# Patient Record
Sex: Female | Born: 1995 | Race: Black or African American | Hispanic: No | Marital: Single | Smoking: Never smoker
Health system: Southern US, Community
[De-identification: ages and names within clinical notes are randomized; demographics above are authoritative.]

## PROBLEM LIST (undated history)

## (undated) DIAGNOSIS — N63 Unspecified lump in unspecified breast: Secondary | ICD-10-CM

## (undated) DIAGNOSIS — D500 Iron deficiency anemia secondary to blood loss (chronic): Secondary | ICD-10-CM

## (undated) DIAGNOSIS — R87611 Atypical squamous cells cannot exclude high grade squamous intraepithelial lesion on cytologic smear of cervix (ASC-H): Secondary | ICD-10-CM

## (undated) HISTORY — DX: Atypical squamous cells cannot exclude high grade squamous intraepithelial lesion on cytologic smear of cervix (ASC-H): R87.611

## (undated) HISTORY — DX: Unspecified lump in unspecified breast: N63.0

## (undated) HISTORY — DX: Iron deficiency anemia secondary to blood loss (chronic): D50.0

---
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
## 2015-02-21 ENCOUNTER — Other Ambulatory Visit: Payer: Self-pay | Admitting: Family Medicine

## 2015-10-13 ENCOUNTER — Other Ambulatory Visit: Payer: Self-pay | Admitting: Family Medicine

## 2015-10-14 NOTE — Telephone Encounter (Signed)
Christine Carr pts mom(DPR signed) wanted to ck status of BC pill refill; advised refill done as requested earlier and Christine Beckey will ck with pharmacy; pt will keep upcoming appt.

## 2015-10-22 ENCOUNTER — Telehealth: Payer: Self-pay | Admitting: *Deleted

## 2015-10-22 ENCOUNTER — Encounter: Payer: Self-pay | Admitting: Family Medicine

## 2015-10-22 ENCOUNTER — Ambulatory Visit (INDEPENDENT_AMBULATORY_CARE_PROVIDER_SITE_OTHER): Payer: Managed Care, Other (non HMO) | Admitting: Family Medicine

## 2015-10-22 VITALS — BP 102/60 | HR 109 | Temp 99.6°F | Ht 65.75 in | Wt 122.2 lb

## 2015-10-22 DIAGNOSIS — J111 Influenza due to unidentified influenza virus with other respiratory manifestations: Secondary | ICD-10-CM | POA: Insufficient documentation

## 2015-10-22 DIAGNOSIS — J029 Acute pharyngitis, unspecified: Secondary | ICD-10-CM

## 2015-10-22 LAB — POCT RAPID STREP A (OFFICE): Rapid Strep A Screen: NEGATIVE

## 2015-10-22 MED ORDER — BENZONATATE 200 MG PO CAPS: 200.0000 mg | ORAL_CAPSULE | Freq: Three times a day (TID) | ORAL | Status: DC | PRN

## 2015-10-22 MED ORDER — OSELTAMIVIR PHOSPHATE 75 MG PO CAPS: 75.0000 mg | ORAL_CAPSULE | Freq: Two times a day (BID) | ORAL | Status: DC

## 2015-10-22 MED ORDER — PROMETHAZINE HCL 25 MG PO TABS: 12.5000 mg | ORAL_TABLET | Freq: Three times a day (TID) | ORAL | Status: DC | PRN

## 2015-10-22 NOTE — Telephone Encounter (Signed)
Christine Carr- I must have forgotten to send that  I will send it now  My apologies

## 2015-10-22 NOTE — Progress Notes (Signed)
Pre visit review using our clinic review tool, if applicable. No additional management support is needed unless otherwise documented below in the visit note. 

## 2015-10-22 NOTE — Patient Instructions (Addendum)
Take tamiflu for flu 5 days (I think you have the flu  Rest and drink fluids  Try phenergan for nausea - it will sedate/ be careful  For cough - mucinex DM max  Also tessalon  Tylenol for fever as directed up to every 8 hours  No school today or tomorrow  Go home and rest

## 2015-10-22 NOTE — Progress Notes (Signed)
Subjective:    Patient ID: Christine Carr, female    DOB: 03/01/1996, 20 y.o.   MRN: [id number]  HPI Here for ST and nausea  Last Tuesday got sick - runny nose and ST- better by the end of the week  Sunday out of nowhere-cough got worse and ST returned  Then chills (no aches) and fever- ? How high  Is nauseated starting today  (on OC no missed periods) No vomiting  No diarrhea   Taking day quil and alka selzer cold  Helpful   Throat pain is not severe   Cough- is both dry and productive - clear mucous  Nasal d/c is clear  No facial pain     Did not get a flu shot   Results for orders placed or performed in visit on 10/22/15  Rapid Strep A  Result Value Ref Range   Rapid Strep A Screen Negative Negative     Temp low grade today    Suite mate was sick - ? A cold  Room mate sick- with uri   Patient Active Problem List   Diagnosis Date Noted  . Viral URI with cough 07/17/2014  . 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 Encounter for other general counseling or advice on contraception 06/12/2013  . Well adolescent visit 01/30/2011   Past Medical History  Diagnosis Date  . Lump or mass in breast 2013    right    No past surgical history on file. Social History  Substance Use Topics  . Smoking status: Never Smoker   . Smokeless tobacco: Never Used  . Alcohol Use: No   No family history on file. No Known Allergies Current Outpatient Prescriptions on File Prior to Visit  Medication Sig Dispense Refill  . fluticasone (FLONASE) 50 MCG/ACT nasal spray Place 2 sprays into both nostrils daily. 16 g 2  . JUNEL 1/20 1-20 MG-MCG tablet TAKE 1 TABLET BY MOUTH DAILY. AS DIRECTED 21 tablet 0   No current facility-administered medications on file prior to visit.      Review of Systems  Constitutional: Positive for fever, chills, appetite change and fatigue.  HENT: Positive for congestion, postnasal drip, rhinorrhea, sinus pressure, sneezing and sore throat.  Negative for ear pain.   Eyes: Negative for pain and discharge.  Respiratory: Positive for cough. Negative for shortness of breath, wheezing and stridor.   Cardiovascular: Negative for chest pain.  Gastrointestinal: Negative for nausea, vomiting and diarrhea.  Genitourinary: Negative for urgency, frequency and hematuria.  Musculoskeletal: Negative for myalgias and arthralgias.  Skin: Negative for rash.  Neurological: Positive for headaches. Negative for dizziness, weakness and light-headedness.  Psychiatric/Behavioral: Negative for confusion and dysphoric mood.   Review of Systems         Objective:   Physical Exam  Constitutional: She appears well-developed and well-nourished. No distress.  HENT:  Head: Normocephalic and atraumatic.  Right Ear: External ear normal.  Left Ear: External ear normal.  Mouth/Throat: Oropharynx is clear and moist.  Nares are injected and congested  No sinus tenderness Clear rhinorrhea and post nasal drip   Eyes: Conjunctivae and EOM are normal. Pupils are equal, round, and reactive to light. Right eye exhibits no discharge. Left eye exhibits no discharge.  Neck: Normal range of motion. Neck supple.  Cardiovascular: Normal rate and normal heart sounds.   Pulmonary/Chest: Effort normal and breath sounds normal. No respiratory distress. She has no wheezes. She has no rales. She exhibits no tenderness.  Harsh cough -  hacking  Lymphadenopathy:    She has no cervical adenopathy.  Neurological: She is alert.  Skin: Skin is warm and dry. No rash noted.  Psychiatric: She has a normal mood and affect.          Assessment & Plan:   Problem List Items Addressed This Visit      Other   Influenza with respiratory manifestation    Clinical dx with fever /chills/cough and ST Cover with tamiflu  Disc symptomatic care - see instructions on AVS - phenergan for nausea/tessalon for cough  Take tamiflu for flu 5 days (I think you have the flu  Rest and  drink fluids  Try phenergan for nausea - it will sedate/ be careful  For cough - mucinex DM max  Also tessalon  Tylenol for fever as directed up to every 8 hours  No school today or tomorrow  Go home and rest  Update if not starting to improve in a week or if worsening         Other Visit Diagnoses    Sore throat    -  Primary    Relevant Orders    Rapid Strep A (Completed)

## 2015-10-22 NOTE — Telephone Encounter (Signed)
Patient was seen today for flu symptoms.  She was advised per AVS to phenergan.  Mom states this was not at the pharmacy when she picked up the other meds.  Okay to send in phenergan?

## 2015-10-23 ENCOUNTER — Ambulatory Visit: Payer: Managed Care, Other (non HMO) | Admitting: Family Medicine

## 2015-10-23 NOTE — Assessment & Plan Note (Signed)
Clinical dx with fever /chills/cough and ST Cover with tamiflu  Disc symptomatic care - see instructions on AVS - phenergan for nausea/tessalon for cough  Take tamiflu for flu 5 days (I think you have the flu  Rest and drink fluids  Try phenergan for nausea - it will sedate/ be careful  For cough - mucinex DM max  Also tessalon  Tylenol for fever as directed up to every 8 hours  No school today or tomorrow  Go home and rest  Update if not starting to improve in a week or if worsening

## 2015-10-24 ENCOUNTER — Telehealth: Payer: Self-pay | Admitting: Family Medicine

## 2015-10-24 NOTE — Telephone Encounter (Signed)
Please write the note and I will sign it tomorrow- that is fine

## 2015-10-24 NOTE — Telephone Encounter (Signed)
Mom called stating pt needs school note she will be out of school all this week Please let them know when it is ready for pick

## 2015-10-25 NOTE — Telephone Encounter (Signed)
Mother notified letter ready for pick-up

## 2015-11-21 ENCOUNTER — Ambulatory Visit: Payer: Self-pay | Admitting: General Surgery

## 2016-01-07 ENCOUNTER — Other Ambulatory Visit: Payer: Self-pay | Admitting: Family Medicine

## 2016-01-20 ENCOUNTER — Ambulatory Visit (INDEPENDENT_AMBULATORY_CARE_PROVIDER_SITE_OTHER): Payer: Managed Care, Other (non HMO) | Admitting: Family Medicine

## 2016-01-20 ENCOUNTER — Encounter: Payer: Self-pay | Admitting: Family Medicine

## 2016-01-20 VITALS — BP 102/64 | HR 96 | Temp 98.4°F | Ht 66.0 in | Wt 120.8 lb

## 2016-01-20 DIAGNOSIS — Z3009 Encounter for other general counseling and advice on contraception: Secondary | ICD-10-CM

## 2016-01-20 MED ORDER — NORETHINDRONE ACET-ETHINYL EST 1-20 MG-MCG PO TABS: ORAL_TABLET | ORAL | Status: DC

## 2016-01-20 NOTE — Progress Notes (Signed)
Subjective:    Patient ID: Christine Carr, female    DOB: [birth year], 20 y.o.   MRN: [id number]  HPI Here for f/u of chronic issues/refills   Doing well  Finished finals last week - did well  Will go back for a summer session - just over a month- will begin organic  Will take a break   Has a week off to do nothing  Will probably get a job in July   Feeling ok  Wt down 2 lb with bmi of 19  Not eating regularly-that will be getting better when she is at home  At school- fast food and snacks  No time for self care    Leroy Kennedy 1/20  Working out well  occ forgets to take it so uses an alarm  Periods are ok - they last 5 days / pretty normal/ not too heavy or painful  Has been out this month   Not currently sexually active  Does not desire STD tests  Still a non smoker  No breast lumps on self exam   No gyn c/o  No vaginal d/c   Has had 3 HPV shots   Can't remember if she had a flu shot every season  Did get the flu in Feb  Will get a flu shot this fall   Patient Active Problem List   Diagnosis Date Noted  . Influenza with respiratory manifestation 10/22/2015  . 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 Encounter for other general counseling or advice on contraception 06/12/2013  . Well adolescent visit 01/30/2011   Past Medical History  Diagnosis Date  . Lump or mass in breast 2013    right    No past surgical history on file. Social History  Substance Use Topics  . Smoking status: Never Smoker   . Smokeless tobacco: Never Used  . Alcohol Use: No   No family history on file. No Known Allergies Current Outpatient Prescriptions on File Prior to Visit  Medication Sig Dispense Refill  . fluticasone (FLONASE) 50 MCG/ACT nasal spray Place 2 sprays into both nostrils daily. 16 g 2  . JUNEL 1/20 1-20 MG-MCG tablet TAKE 1 TABLET BY MOUTH DAILY. AS DIRECTED 21 tablet 0  . benzonatate (TESSALON) 200 MG capsule Take 1 capsule (200 mg total) by mouth 3 (three)  times daily as needed for cough (swallow whole- do not bite pill). For cough (Patient not taking: Reported on 01/20/2016) 30 capsule 1   No current facility-administered medications on file prior to visit.    Review of Systems    Review of Systems  Constitutional: Negative for fever, appetite change, fatigue and unexpected weight change.  Eyes: Negative for pain and visual disturbance.  Respiratory: Negative for cough and shortness of breath.   Cardiovascular: Negative for cp or palpitations    Gastrointestinal: Negative for nausea, diarrhea and constipation.  Genitourinary: Negative for urgency and frequency.  Skin: Negative for pallor or rash   Neurological: Negative for weakness, light-headedness, numbness and headaches.  Hematological: Negative for adenopathy. Does not bruise/bleed easily.  Psychiatric/Behavioral: Negative for dysphoric mood. The patient is not nervous/anxious.      Objective:   Physical Exam  Constitutional: She appears well-developed and well-nourished. No distress.  Well appearing  HENT:  Head: Normocephalic and atraumatic.  Mouth/Throat: Oropharynx is clear and moist.  Eyes: Conjunctivae and EOM are normal. Pupils are equal, round, and reactive to light.  Neck: Normal range of motion. Neck supple. No JVD present.  Carotid bruit is not present. No thyromegaly present.  Cardiovascular: Normal rate, regular rhythm, normal heart sounds and intact distal pulses.  Exam reveals no gallop.   Pulmonary/Chest: Effort normal and breath sounds normal. No respiratory distress. She has no wheezes. She has no rales.  No crackles  Abdominal: Soft. Bowel sounds are normal. She exhibits no distension, no abdominal bruit and no mass. There is no tenderness.  No suprapubic tenderness or fullness    Musculoskeletal: She exhibits no edema.  Lymphadenopathy:    She has no cervical adenopathy.  Neurological: She is alert. She has normal reflexes.  Skin: Skin is warm and dry. No  rash noted.  Psychiatric: She has a normal mood and affect.  Pleasant and talkative          Assessment & Plan:   Problem List Items Addressed This Visit      Other   Encounter for other general counseling or advice on contraception - Primary    Pt is doing well on current OC and that was refilled Rev menstrual hx  Not sexually active-does not desire STD screen Disc imp of not smoking and taking med compliantly  Will plan on pap at age 67 Has had HPV vaccines

## 2016-01-20 NOTE — Patient Instructions (Signed)
I refilled your pills Don't smoke  Take care of yourself -try not to miss meals  Don't forget to get a flu shot in the fall If you ever want STD screening let me know

## 2016-01-20 NOTE — Progress Notes (Signed)
Pre visit review using our clinic review tool, if applicable. No additional management support is needed unless otherwise documented below in the visit note. 

## 2016-01-20 NOTE — Assessment & Plan Note (Signed)
Pt is doing well on current OC and that was refilled Rev menstrual hx  Not sexually active-does not desire STD screen Disc imp of not smoking and taking med compliantly  Will plan on pap at age 20 Has had HPV vaccines

## 2016-10-07 ENCOUNTER — Encounter: Payer: Self-pay | Admitting: *Deleted

## 2016-10-13 ENCOUNTER — Inpatient Hospital Stay: Payer: Self-pay

## 2016-10-13 ENCOUNTER — Encounter: Payer: Self-pay | Admitting: General Surgery

## 2016-10-13 ENCOUNTER — Ambulatory Visit (INDEPENDENT_AMBULATORY_CARE_PROVIDER_SITE_OTHER): Payer: Managed Care, Other (non HMO) | Admitting: General Surgery

## 2016-10-13 VITALS — BP 110/70 | HR 88 | Resp 12 | Ht 65.0 in | Wt 127.0 lb

## 2016-10-13 DIAGNOSIS — N6310 Unspecified lump in the right breast, unspecified quadrant: Secondary | ICD-10-CM | POA: Diagnosis not present

## 2016-10-13 NOTE — Patient Instructions (Signed)
  Patient to return in one year right breast ultrasound.

## 2016-10-13 NOTE — Progress Notes (Signed)
Patient ID: Christine Carr, female   DOB: June 27, 1996, 21 y.o.   MRN: [id number]  Chief Complaint  Patient presents with  . Follow-up    right breast mass    HPI Christine Carr is a 21 y.o. female here today for her follow up right breast mass. Patient states they area may be a little bigger, but remains asymptomatic.  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 Remains on the teens list.  The patient did miss/ears appointment. HPI  Past Medical History:  Diagnosis Date  . Lump or mass in breast 2013   right     No past surgical history on file.  No family history on file.  Social History Social History  Substance Use Topics  . Smoking status: Never Smoker  . Smokeless tobacco: Never Used  . Alcohol use No    No Known Allergies  Current Outpatient Prescriptions  Medication Sig Dispense Refill  . fluticasone (FLONASE) 50 MCG/ACT nasal spray Place 2 sprays into both nostrils daily. 16 g 2  . norethindrone-ethinyl estradiol (JUNEL 1/20) 1-20 MG-MCG tablet TAKE 1 TABLET BY MOUTH DAILY. AS DIRECTED 21 tablet 11   No current facility-administered medications for this visit.     Review of Systems Review of Systems  Constitutional: Negative.   Respiratory: Negative.   Cardiovascular: Negative.     Blood pressure 110/70, pulse 88, resp. rate 12, height 5\' 5"  (1.651 m), weight 127 lb (57.6 kg).  Physical Exam Physical Exam  Constitutional: She is oriented to person, place, and time. She appears well-developed and well-nourished.  Eyes: Conjunctivae are normal. No scleral icterus.  Neck: Neck supple.  Cardiovascular: Normal rate, regular rhythm and normal heart sounds.   Pulmonary/Chest: Effort normal and breath sounds normal. Right breast exhibits mass. Right breast exhibits no inverted nipple, no nipple discharge, no skin change and no tenderness. Left breast exhibits no inverted nipple, no mass, no nipple discharge, no skin change and no tenderness.     Abdominal: Soft. Bowel sounds are normal. There is no tenderness.  Lymphadenopathy:    She has no cervical adenopathy.  Neurological: She is alert and oriented to person, place, and time.  Skin: Skin is warm and dry.    Data Reviewed Ultrasound examination of the right breast was completed to assess the multiple previously identified nodules.  At the 10:00 position 8 cm from the nipple a 1.0 x 1.51 x 1.59 cm mass is identified. This is smoothly marginated, shows posterior acoustic enhancement and a homogeneous echo pattern. This is consistent with a fibroadenoma. Previously biopsied.  At the 10:00 position 1 cm from the nipple a 0.4 x 0.73 x 0.93 hypoechoic, smoothly marginated mass with faint posterior acoustic enhancement is noted again consistent with a fibroadenoma.  At the 9:00 position 1 cm from the nipple a smoothly marginated 0.77 x 1.4 x 1.47 cm hypoechoic mass with a homogeneous echo pattern in faint acoustic enhancement is noted, consistent with a fibroadenoma.  At the 8:00 position, 4 cm from the nipple a softly lobulated hypoechoic mass with faint posterior acoustic enhancement is noted measuring 0.73 x 1.17 x 1.3 cm.This is consistent with a multilobulated fibroadenoma. Each of these lesion showed a slight increase in size since her examination 2 years ago. The most dramatic changes in the lesion at the 8:00 position which shows a 3 mm increase in size over the last 2 years.BI-RADS-3.  February 20, 2013 right breast biopsy at 10:00 position:  RIGHT BREAST 10:00, BIOPSY: -  BENIGN BREAST TISSUE WITH NODULAR FIBROCYSTIC CHANGE. - NEGATIVE FOR ATYPIA AND MALIGNANCY. Comment Sections demonstrate a well circumscribed area of fibrocystic change including adenosis, cyst formation, pseudo angiomatous stromal hyperplasia, and fibroadenomatous change.    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    Plan         Patient to return in one year right breast ultrasound.  This information has been scribed by Gaspar Cola CMA.  Robert Bellow 10/14/2016, 7:44 PM

## 2016-12-23 ENCOUNTER — Encounter: Payer: Self-pay | Admitting: Emergency Medicine

## 2016-12-23 ENCOUNTER — Encounter (INDEPENDENT_AMBULATORY_CARE_PROVIDER_SITE_OTHER): Payer: Self-pay

## 2016-12-23 ENCOUNTER — Ambulatory Visit (INDEPENDENT_AMBULATORY_CARE_PROVIDER_SITE_OTHER): Payer: Managed Care, Other (non HMO) | Admitting: Family Medicine

## 2016-12-23 ENCOUNTER — Encounter: Payer: Self-pay | Admitting: Family Medicine

## 2016-12-23 ENCOUNTER — Ambulatory Visit: Payer: Managed Care, Other (non HMO) | Admitting: Family Medicine

## 2016-12-23 VITALS — BP 116/84 | HR 96 | Temp 98.5°F | Wt 124.5 lb

## 2016-12-23 DIAGNOSIS — J069 Acute upper respiratory infection, unspecified: Secondary | ICD-10-CM

## 2016-12-23 NOTE — Patient Instructions (Signed)
For nasal congestion you can use Afrin nasal spray for 3 days max, Sudafed, saline nasal spray (generic is fine for all). Get an over the counter antihistamine- Zyrtec, Allegra or Claritin- generic is fine For cough you can try Delsym. Drink enough fluids to make your urine light yellow. For fever/chill/muscle aches you can take over the counter acetaminophen or ibuprofen.  Please come back in if you are not better in 5-7 days or if you develop wheezing, shortness of breath or persistent vomiting.

## 2016-12-23 NOTE — Progress Notes (Signed)
Pre visit review using our clinic review tool, if applicable. No additional management support is needed unless otherwise documented below in the visit note. 

## 2016-12-23 NOTE — Progress Notes (Signed)
   Subjective:    Patient ID: Christine Carr, female    DOB: 1996/01/17, 21 y.o.   MRN: [id number]  HPI This is a 21 yo female who presents today with cough (nonproductive) and nasal congestion x 2 days. Sore and scratchy throat, runny nose with clear drainage. Felt warm, no known fever. No SOB or wheeze. Took some Dayquil today with some relief. Has also been sneezing. No known sick contacts.  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   Past Medical History:  Diagnosis Date  . Lump or mass in breast 2013   right    No past surgical history on file. No family history on file. Social History  Substance Use Topics  . Smoking status: Never Smoker  . Smokeless tobacco: Never Used  . Alcohol use No      Review of Systems Per HPI    Objective:   Physical Exam  Constitutional: She appears well-developed and well-nourished. No distress.  HENT:  Head: Normocephalic and atraumatic.  Right Ear: Tympanic membrane, external ear and ear canal normal.  Left Ear: Tympanic membrane, external ear and ear canal normal.  Nose: Mucosal edema and rhinorrhea present.  Mouth/Throat: Uvula is midline. No oropharyngeal exudate, posterior oropharyngeal edema or posterior oropharyngeal erythema.  Large amount post nasal drainage.   Skin: She is not diaphoretic.  Vitals reviewed.     BP 116/84 (BP Location: Left Arm, Patient Position: Sitting, Cuff Size: Normal)   Pulse 96   Temp 98.5 F (36.9 C) (Oral)   Wt 124 lb 8 oz (56.5 kg)   LMP 12/01/2016   SpO2 99%   BMI 20.72 kg/m  Wt Readings from Last 3 Encounters:  12/23/16 124 lb 8 oz (56.5 kg)  10/13/16 127 lb (57.6 kg)  01/20/16 120 lb 12.8 oz (54.8 kg) (36 %, Z= -0.36)*   * Growth percentiles are based on CDC 2-20 Years data.       Assessment & Plan:  1. Viral URI -  Patient Instructions  For nasal congestion you can use Afrin nasal spray for 3 days max, Sudafed, saline nasal spray (generic is  fine for all). Get an over the counter antihistamine- Zyrtec, Allegra or Claritin- generic is fine For cough you can try Delsym. Drink enough fluids to make your urine light yellow. For fever/chill/muscle aches you can take over the counter acetaminophen or ibuprofen.  Please come back in if you are not better in 5-7 days or if you develop wheezing, shortness of breath or persistent vomiting.     Christine Reamer, FNP-BC  [facility]  12/23/2016 2:29 PM

## 2017-01-20 ENCOUNTER — Other Ambulatory Visit: Payer: Self-pay | Admitting: Family Medicine

## 2017-01-20 NOTE — Telephone Encounter (Signed)
Pt hasn't seen you in over a year but has had a few acute appts with other providers, and no future appts., please advise

## 2017-01-20 NOTE — Telephone Encounter (Signed)
Please schedule f/u for 30 min PE with gyn exam after her 81 birthday in [place]  Refill until then  Thanks

## 2017-01-22 NOTE — Telephone Encounter (Signed)
Left voicemail requesting pt to call the office back and schedule her CPE after her bday

## 2017-01-25 NOTE — Telephone Encounter (Signed)
done

## 2017-01-25 NOTE — Telephone Encounter (Signed)
Patient scheduled cpx on 05/28/17.  Please refill bcp.

## 2017-05-28 ENCOUNTER — Encounter: Payer: Self-pay | Admitting: Family Medicine

## 2017-05-28 ENCOUNTER — Ambulatory Visit (INDEPENDENT_AMBULATORY_CARE_PROVIDER_SITE_OTHER): Payer: 59 | Admitting: Family Medicine

## 2017-05-28 VITALS — BP 104/62 | HR 76 | Temp 98.4°F | Ht 66.0 in | Wt 124.8 lb

## 2017-05-28 DIAGNOSIS — N6011 Diffuse cystic mastopathy of right breast: Secondary | ICD-10-CM | POA: Diagnosis not present

## 2017-05-28 DIAGNOSIS — Z23 Encounter for immunization: Secondary | ICD-10-CM | POA: Diagnosis not present

## 2017-05-28 DIAGNOSIS — Z0000 Encounter for general adult medical examination without abnormal findings: Secondary | ICD-10-CM | POA: Diagnosis not present

## 2017-05-28 DIAGNOSIS — T2130XD Burn of third degree of trunk, unspecified site, subsequent encounter: Secondary | ICD-10-CM

## 2017-05-28 DIAGNOSIS — N6012 Diffuse cystic mastopathy of left breast: Secondary | ICD-10-CM

## 2017-05-28 MED ORDER — NORETHINDRONE ACET-ETHINYL EST 1-20 MG-MCG PO TABS: 1.0000 | ORAL_TABLET | Freq: Every day | ORAL | 11 refills | Status: DC

## 2017-05-28 NOTE — Progress Notes (Signed)
Subjective:    Patient ID: Christine Carr, female    DOB: Jan 13, 1996, 21 y.o.   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   Wt Readings from Last 3 Encounters:  05/28/17 124 lb 12 oz (56.6 kg)  12/23/16 124 lb 8 oz (56.5 kg)  10/13/16 127 lb (57.6 kg)  stable  20.14 kg/m  HIV screen   Tetanus shot 8/08  Will get today   She had had HPV vaccine series  Pap/gyn exam  Never been sexually active  On junel 1/20- periods are pretty regular  occ cramps /this varies  Last period was heavier but she missed a pill Otherwise-pretty tolerable   Has had [place] for breast lumps/fibrocystic dz Sees Dr Fleet Contras yearly-in the spring    She is in a relationship currently   No exp or concerns for STDs    Flu shot -will get today   Diet is healthy / tried her best  Too much take out but her mother cooks  Lots of vegetables  Exercise - walking to class    Family hx: Aunt had lung cancer (maternal)  M uncle - DM  M uncle - CAD   Non smoker   No new breast lumps  Not interested in wellness labs yet   Patient Active Problem List   Diagnosis Date Noted  . Routine general medical examination at a health care facility 05/28/2017  . Fibrocystic breast changes of both breasts 11/21/2013  . Encounter for other general counseling or advice on contraception 06/12/2013   Past Medical History:  Diagnosis Date  . Lump or mass in breast 2013   right    No past surgical history on file. Social History  Substance Use Topics  . Smoking status: Never Smoker  . Smokeless tobacco: Never Used  . Alcohol use 0.0 oz/week     Comment: socially   Family History  Problem Relation Age of Onset  . Lung cancer Maternal Aunt   . CAD Maternal Uncle    No Known Allergies Current Outpatient Prescriptions on File Prior to Visit  Medication  Sig Dispense Refill  . fluticasone (FLONASE) 50 MCG/ACT nasal spray Place 2 sprays into both nostrils daily. 16 g 2   No current facility-administered medications on file prior to visit.     Review of Systems  Constitutional: Negative for activity change, appetite change, fatigue, fever and unexpected weight change.  HENT: Negative for congestion, ear pain, rhinorrhea, sinus pressure and sore throat.   Eyes: Negative for pain, redness and visual disturbance.  Respiratory: Negative for cough, shortness of breath and wheezing.   Cardiovascular: Negative for chest pain and palpitations.  Gastrointestinal: Negative for abdominal pain, blood in stool, constipation and diarrhea.  Endocrine: Negative for polydipsia and polyuria.  Genitourinary: Negative for dysuria, frequency and urgency.  Musculoskeletal: Negative for arthralgias, back pain and myalgias.  Skin: Negative for pallor and rash.  Allergic/Immunologic: Negative for environmental allergies.  Neurological: Negative for dizziness, syncope and headaches.  Hematological: Negative for adenopathy. Does not bruise/bleed easily.  Psychiatric/Behavioral: Negative for decreased concentration and dysphoric mood. The patient is not nervous/anxious.        Objective:   Physical Exam  Constitutional: She appears well-developed and well-nourished. No distress.  Well appearing   HENT:  Head: Normocephalic and atraumatic.  Right  Ear: External ear normal.  Left Ear: External ear normal.  Mouth/Throat: Oropharynx is clear and moist.  Eyes: Pupils are equal, round, and reactive to light. Conjunctivae and EOM are normal. No scleral icterus.  Neck: Normal range of motion. Neck supple. No JVD present. Carotid bruit is not present. No thyromegaly present.  Cardiovascular: Normal rate, regular rhythm, normal heart sounds and intact distal pulses.  Exam reveals no gallop.   Pulmonary/Chest: Effort normal and breath sounds normal. No respiratory  distress. She has no wheezes. She exhibits no tenderness.  Abdominal: Soft. Bowel sounds are normal. She exhibits no distension, no abdominal bruit and no mass. There is no tenderness.  Genitourinary: No breast swelling, tenderness, discharge or bleeding.  Genitourinary Comments: Breast exam: No nodules, thickening, tenderness, bulging, retraction, inflamation, nipple discharge or skin changes noted.  No axillary or clavicular LA.    Grossly fibrocystic and dense tissue -stable     Musculoskeletal: Normal range of motion. She exhibits no edema or tenderness.  Lymphadenopathy:    She has no cervical adenopathy.  Neurological: She is alert. She has normal reflexes. No cranial nerve deficit. She exhibits normal muscle tone. Coordination normal.  Skin: Skin is warm and dry. No rash noted. No erythema. No pallor.  Psychiatric: She has a normal mood and affect.  Cheerful and talkative           Assessment & Plan:   Problem List Items Addressed This Visit      Other   Fibrocystic breast changes of both breasts    Has had multiple [place]  Sees Dr Fleet Contras yearly        Routine general medical examination at a health care facility    Reviewed health habits including diet and exercise and skin cancer prevention Reviewed appropriate screening tests for age  Also reviewed health mt list, fam hx and immunization status , as well as social and family history   Tdap today Flu vaccine today  Declines STD screen-never sexually active  Will hold off on pap /gyn also since never sexually active She has had HPV vaccines  Goes to Dr Fleet Contras for breast screening /fibrocystic breasts  Declines wellness labs -may do next year

## 2017-05-28 NOTE — Assessment & Plan Note (Signed)
Has had multiple [place]  Sees Dr Fleet Contras yearly

## 2017-05-28 NOTE — Patient Instructions (Addendum)
Tdap and flu shots today  Do self breast exams monthly after your period Continue follow up with Dr Bary Castilla as well   Eat healthy and get regular exercise

## 2017-05-28 NOTE — Assessment & Plan Note (Signed)
Reviewed health habits including diet and exercise and skin cancer prevention Reviewed appropriate screening tests for age  Also reviewed health mt list, fam hx and immunization status , as well as social and family history   Tdap today Flu vaccine today  Declines STD screen-never sexually active  Will hold off on pap /gyn also since never sexually active She has had HPV vaccines  Goes to Dr Fleet Contras for breast screening /fibrocystic breasts  Declines wellness labs -may do next year

## 2017-10-12 ENCOUNTER — Telehealth: Payer: Self-pay | Admitting: General Surgery

## 2017-10-12 NOTE — Telephone Encounter (Signed)
I HAVE CALLED & L/M FOR PATIENT TO CONFIRM IF SHE MENT TO CANCEL HER APPOINTMENT WITH DR BYRNETT ON 10-14-17 @ 4:00PM. IF SO PLEASE RE-ENTER.

## 2017-10-14 ENCOUNTER — Ambulatory Visit: Payer: Managed Care, Other (non HMO) | Admitting: General Surgery

## 2017-11-16 ENCOUNTER — Encounter: Payer: Self-pay | Admitting: *Deleted

## 2018-02-22 ENCOUNTER — Ambulatory Visit: Payer: 59 | Admitting: Family Medicine

## 2018-02-22 ENCOUNTER — Encounter: Payer: Self-pay | Admitting: Family Medicine

## 2018-02-22 VITALS — BP 100/64 | HR 82 | Temp 98.9°F | Ht 66.0 in | Wt 125.5 lb

## 2018-02-22 DIAGNOSIS — J029 Acute pharyngitis, unspecified: Secondary | ICD-10-CM | POA: Diagnosis not present

## 2018-02-22 LAB — POCT RAPID STREP A (OFFICE): Rapid Strep A Screen: POSITIVE — AB

## 2018-02-22 MED ORDER — AMOXICILLIN 875 MG PO TABS: 875.0000 mg | ORAL_TABLET | Freq: Two times a day (BID) | ORAL | 0 refills | Status: AC

## 2018-02-22 MED ORDER — LIDOCAINE VISCOUS HCL 2 % MT SOLN: 5.0000 mL | OROMUCOSAL | 0 refills | Status: DC | PRN

## 2018-02-22 NOTE — Progress Notes (Signed)
Recent [employer] grad.  She needs a note about missing a job interview today.  Note given to patient.    Sx started about 3-4 days ago.  Started with ST.  Then had a fever intermittently.  Aches.  Chills.  Some sweats. No cough.  No congestion.  Some nausea.    Meds, vitals, and allergies reviewed.   ROS: Per HPI unless specifically indicated in ROS section    GEN: nad, alert and oriented HEENT: mucous membranes moist, tm w/o erythema, nasal exam w/o erythema, clear discharge noted,  OP with cobblestoning, no exudates. Sinuses not ttp  NECK: supple w/o LA CV: rrr.   PULM: ctab, no inc wob EXT: no edema SKIN: well perfused.    RST faintly positive.  D/w pt.

## 2018-02-22 NOTE — Patient Instructions (Signed)
Out of work for now.  Start amoxil.  Drink plenty of fluids, take tylenol as needed, and gargle with warm salt water for your throat.  This should gradually improve.  Use lidocaine for throat pain if needed.   Take care.  Let us know if you have other concerns.

## 2018-02-23 DIAGNOSIS — J029 Acute pharyngitis, unspecified: Secondary | ICD-10-CM | POA: Insufficient documentation

## 2018-02-23 NOTE — Assessment & Plan Note (Signed)
Presumed strep throat.  Nontoxic.  Okay for outpatient follow-up.  Supportive care.  Note for work given to patient.  Start amoxicillin.  Routine antibiotic cautions given, especially regarding birth control pills.  Gargle salt water.  Can use lidocaine if needed.  See after visit summary.

## 2018-04-05 ENCOUNTER — Telehealth: Payer: Self-pay

## 2018-04-05 NOTE — Telephone Encounter (Signed)
Letter given to CMA who is doing the NV in the am

## 2018-04-05 NOTE — Telephone Encounter (Signed)
Copied from [place] [id number]. Topic: Inquiry >> Apr 05, 2018  2:39 PM Vernona Rieger wrote: Reason for CRM: Patient wants a note stating that she has had a physical with Dr Glori Bickers 05/28/17. Patient will be in tomorrow for a TB test and would like to know could she pick that up then. Thanks

## 2018-04-05 NOTE — Telephone Encounter (Signed)
Done and in IN box 

## 2018-04-06 ENCOUNTER — Ambulatory Visit (INDEPENDENT_AMBULATORY_CARE_PROVIDER_SITE_OTHER): Payer: 59

## 2018-04-06 DIAGNOSIS — Z111 Encounter for screening for respiratory tuberculosis: Secondary | ICD-10-CM

## 2018-04-06 NOTE — Progress Notes (Signed)
Per orders of Dr. Glori Bickers, injection of TB skin test given by Brenton Grills. Patient tolerated injection well.

## 2018-04-13 ENCOUNTER — Ambulatory Visit: Payer: 59

## 2018-04-20 ENCOUNTER — Ambulatory Visit (INDEPENDENT_AMBULATORY_CARE_PROVIDER_SITE_OTHER): Payer: 59

## 2018-04-20 DIAGNOSIS — Z111 Encounter for screening for respiratory tuberculosis: Secondary | ICD-10-CM

## 2018-04-22 LAB — TB SKIN TEST
Induration: 0 mm
TB Skin Test: NEGATIVE

## 2018-05-05 ENCOUNTER — Telehealth: Payer: Self-pay | Admitting: Family Medicine

## 2018-05-05 NOTE — Telephone Encounter (Signed)
Opened in error

## 2018-06-16 ENCOUNTER — Other Ambulatory Visit: Payer: Self-pay | Admitting: *Deleted

## 2018-06-16 ENCOUNTER — Ambulatory Visit (INDEPENDENT_AMBULATORY_CARE_PROVIDER_SITE_OTHER): Payer: 59 | Admitting: Family Medicine

## 2018-06-16 ENCOUNTER — Encounter: Payer: Self-pay | Admitting: Family Medicine

## 2018-06-16 VITALS — BP 112/80 | HR 81 | Temp 98.5°F | Ht 66.0 in | Wt 136.2 lb

## 2018-06-16 DIAGNOSIS — L989 Disorder of the skin and subcutaneous tissue, unspecified: Secondary | ICD-10-CM | POA: Diagnosis not present

## 2018-06-16 NOTE — Telephone Encounter (Signed)
Please schedule a PE and refil until then

## 2018-06-16 NOTE — Telephone Encounter (Signed)
Pt had multiple acute appts (had appt with Dr. Damita Dunnings today) but no recent f/u or CPE in over a year, please advise

## 2018-06-16 NOTE — Progress Notes (Signed)
Oral lesion. At the L side of the mouth, at the corner/juction of the lips, externally.  Present for a few days.  Not painful but felt irritated.  She never saw a blister or ulcer.  No sx like this prev.  No FCNAVD.  No other other lesions.  Lesion is some better in the meantime.   No tobacco.    Working at [employer].  Flu shot done at work about 2 weeks ago.    Meds, vitals, and allergies reviewed.   ROS: Per HPI unless specifically indicated in ROS section   nad ncat TM wnl Nasal and OP exam wnl except for small area of mild irritation at the junction of the lips on the left side, externally.  No intraoral lesions noted.  No ulceration.  No fluctuant mass.  No crusting. Neck supple no lymphadenopathy.

## 2018-06-16 NOTE — Patient Instructions (Signed)
Update [place] as needed.  Take care.  Glad to see you.  We'll contact you with your lab report.

## 2018-06-17 MED ORDER — NORETHINDRONE ACET-ETHINYL EST 1-20 MG-MCG PO TABS: 1.0000 | ORAL_TABLET | Freq: Every day | ORAL | 0 refills | Status: DC

## 2018-06-17 NOTE — Telephone Encounter (Signed)
Med refilled once and Carrie will reach out to pt to try and get appt scheduled  

## 2018-06-19 LAB — HERPES SIMPLEX VIRUS CULTURE

## 2018-06-19 NOTE — Assessment & Plan Note (Signed)
Check swab for HSV today.  See notes on labs.  Likely benign irritation.  Should resolve.  Update [place] as needed.  She agrees.

## 2018-07-11 ENCOUNTER — Other Ambulatory Visit: Payer: Self-pay | Admitting: *Deleted

## 2018-07-11 MED ORDER — NORETHINDRONE ACET-ETHINYL EST 1-20 MG-MCG PO TABS: 1.0000 | ORAL_TABLET | Freq: Every day | ORAL | 0 refills | Status: DC

## 2018-07-20 ENCOUNTER — Encounter: Payer: 59 | Admitting: Family Medicine

## 2018-08-01 ENCOUNTER — Encounter: Payer: Self-pay | Admitting: Family Medicine

## 2018-08-01 ENCOUNTER — Ambulatory Visit (INDEPENDENT_AMBULATORY_CARE_PROVIDER_SITE_OTHER): Payer: 59 | Admitting: Family Medicine

## 2018-08-01 ENCOUNTER — Other Ambulatory Visit (HOSPITAL_COMMUNITY)
Admission: RE | Admit: 2018-08-01 | Discharge: 2018-08-01 | Disposition: A | Discharge status: Home / Self Care | Payer: 59 | Source: Ambulatory Visit | Attending: Family Medicine | Admitting: Family Medicine

## 2018-08-01 VITALS — BP 102/64 | HR 74 | Temp 98.6°F | Ht 66.25 in | Wt 133.8 lb

## 2018-08-01 DIAGNOSIS — Z0000 Encounter for general adult medical examination without abnormal findings: Secondary | ICD-10-CM | POA: Diagnosis not present

## 2018-08-01 DIAGNOSIS — Z1231 Encounter for screening mammogram for malignant neoplasm of breast: Secondary | ICD-10-CM | POA: Insufficient documentation

## 2018-08-01 DIAGNOSIS — Z01419 Encounter for gynecological examination (general) (routine) without abnormal findings: Secondary | ICD-10-CM | POA: Insufficient documentation

## 2018-08-01 MED ORDER — NORETHINDRONE ACET-ETHINYL EST 1-20 MG-MCG PO TABS: 1.0000 | ORAL_TABLET | Freq: Every day | ORAL | 11 refills | Status: DC

## 2018-08-01 NOTE — Progress Notes (Signed)
Subjective:    Patient ID: Christine Carr, female    DOB: 1995/09/15, 22 y.o.   MRN: [id number]  HPI Here for health maintenance exam and to review chronic medical problems    Working a lot  At [employer]- likes it there  Is prepping to go back to school (wants to be a dietician)- (has nutrition deg)  2 classes to starting in jan to finish up    Wt Readings from Last 3 Encounters:  08/01/18 133 lb 12 oz (60.7 kg)  06/16/18 136 lb 4 oz (61.8 kg)  02/22/18 125 lb 8 oz (56.9 kg)  wt is stable - eating well  Taking care of herself fairly - needs to make time to exercise  21.43 kg/m   Pap /gyn  Has been sexually active  STD screen - does not desire  One partner  Has had HPV series of vaccines  Needs first pap/gyn exam today   OC- junel 1/20- stopped taking less than 6 months ago  Too hard to remember to take  She goes back and forth Compliance is the issue  Wants to try the pill again (setting alarm on phone)   When on pill- menses was pretty light and about 5-7 days Off the pill they are heavier (still last as long)  No bad cramping   Non smoker   Fibrocystic change or breast Has seen Dr Bary Castilla  Tetanus shot 9/18   Flu shot 10/19    Wellness labs -declines today    Patient Active Problem List   Diagnosis Date Noted  . Encounter for screening mammogram for breast cancer 08/01/2018  . Encounter for annual routine gynecological examination 08/01/2018  . Skin lesion 06/16/2018  . Sore throat 02/23/2018  . Routine general medical examination at a health care facility 05/28/2017  . Fibrocystic breast changes of both breasts 11/21/2013  . Encounter for other general counseling or advice on contraception 06/12/2013   Past Medical History:  Diagnosis Date  . Lump or mass in breast 2013   right    History reviewed. No pertinent surgical history. Social History   Tobacco Use  . Smoking status: Never Smoker  . Smokeless tobacco: Never Used  Substance Use  Topics  . Alcohol use: Yes    Alcohol/week: 0.0 standard drinks    Comment: socially  . Drug use: No   Family History  Problem Relation Age of Onset  . Lung cancer Maternal Aunt   . CAD Maternal Uncle    No Known Allergies No current outpatient medications on file prior to visit.   No current facility-administered medications on file prior to visit.     Review of Systems  Constitutional: Negative for activity change, appetite change, fatigue, fever and unexpected weight change.  HENT: Negative for congestion, ear pain, rhinorrhea, sinus pressure and sore throat.   Eyes: Negative for pain, redness and visual disturbance.  Respiratory: Negative for cough, shortness of breath and wheezing.   Cardiovascular: Negative for chest pain and palpitations.  Gastrointestinal: Negative for abdominal pain, blood in stool, constipation and diarrhea.  Endocrine: Negative for polydipsia and polyuria.  Genitourinary: Negative for dysuria, frequency and urgency.  Musculoskeletal: Negative for arthralgias, back pain and myalgias.  Skin: Negative for pallor and rash.  Allergic/Immunologic: Negative for environmental allergies.  Neurological: Negative for dizziness, syncope and headaches.  Hematological: Negative for adenopathy. Does not bruise/bleed easily.  Psychiatric/Behavioral: Negative for decreased concentration and dysphoric mood. The patient is not nervous/anxious.  Objective:   Physical Exam  Constitutional: She appears well-developed and well-nourished. No distress.  Slim and well app   HENT:  Head: Normocephalic and atraumatic.  Right Ear: External ear normal.  Left Ear: External ear normal.  Mouth/Throat: Oropharynx is clear and moist.  Eyes: Pupils are equal, round, and reactive to light. Conjunctivae and EOM are normal. Right eye exhibits no discharge. Left eye exhibits no discharge. No scleral icterus.  Neck: Normal range of motion. Neck supple. No JVD present. Carotid  bruit is not present. No thyromegaly present.  Cardiovascular: Normal rate, regular rhythm, normal heart sounds and intact distal pulses. Exam reveals no gallop.  Pulmonary/Chest: Effort normal and breath sounds normal. No respiratory distress. She has no wheezes. She exhibits no tenderness. No breast tenderness, discharge or bleeding.  Abdominal: Soft. Bowel sounds are normal. She exhibits no distension, no abdominal bruit and no mass. There is no tenderness.  Genitourinary: No breast tenderness, discharge or bleeding.  Genitourinary Comments: Breast exam: No mass, nodules, thickening, tenderness, bulging, retraction, inflamation, nipple discharge or skin changes noted.  No axillary or clavicular LA.    (dense breast tissue)          Anus appears normal w/o hemorrhoids or masses     External genitalia : nl appearance and hair distribution/no lesions     Urethral meatus : nl size, no lesions or prolapse     Urethra: no masses, tenderness or scarring    Bladder : no masses or tenderness   Pt is slim- pelvic bone is prominent    Vagina: nl general appearance, no discharge or  Lesions, no significant cystocele  or rectocele     Cervix: no lesions/ discharge or friability    Uterus: nl size, contour, position, and mobility (not fixed) , non tender    Adnexa : no masses, tenderness, enlargement or nodularity        Musculoskeletal: Normal range of motion. She exhibits no edema, tenderness or deformity.  Lymphadenopathy:    She has no cervical adenopathy.  Neurological: She is alert. She has normal reflexes. She displays normal reflexes. No cranial nerve deficit. She exhibits normal muscle tone. Coordination normal.  Skin: Skin is warm and dry. No rash noted. No erythema. No pallor.  Few lentigines   Psychiatric: She has a normal mood and affect.  Pleasant  Mildly anxious today          Assessment & Plan:   Problem List Items Addressed This Visit      Other    Routine general medical examination at a health care facility - Primary    Reviewed health habits including diet and exercise and skin cancer prevention Reviewed appropriate screening tests for age  Also reviewed health mt list, fam hx and immunization status , as well as social and family history   See HPI Pt declined wellness labs  Gyn exam/pap done today with gc/chlam  Has had HPV vaccines Declines addn STD screen Disc imp of balance -self care with work and school       Encounter for annual routine gynecological examination    Exam and pap done (first pap so added gc/chlam) She declined additional std screen  OC refilled Disc ways to be compliant with it (cell phone alarm)  If not able to do that ? If candidate for IUD/ implant or depo provera Reminded to use condoms to prev STDs as well         Relevant Orders   Cytology -  PAP([place])

## 2018-08-01 NOTE — Patient Instructions (Signed)
Start the pill the first Sunday after your period starts  Set daily alarm on your cell phone so you do not miss doses   The pill does not protect against STDs   Eat a healthy balanced diet Try to get some exercise and stay fit

## 2018-08-01 NOTE — Assessment & Plan Note (Signed)
Reviewed health habits including diet and exercise and skin cancer prevention Reviewed appropriate screening tests for age  Also reviewed health mt list, fam hx and immunization status , as well as social and family history   See HPI Pt declined wellness labs  Gyn exam/pap done today with gc/chlam  Has had HPV vaccines Declines addn STD screen Disc imp of balance -self care with work and school

## 2018-08-01 NOTE — Assessment & Plan Note (Signed)
Exam and pap done (first pap so added gc/chlam) She declined additional std screen  OC refilled Disc ways to be compliant with it (cell phone alarm)  If not able to do that ? If candidate for IUD/ implant or depo provera Reminded to use condoms to prev STDs as well

## 2018-08-03 LAB — CYTOLOGY - PAP
CHLAMYDIA, DNA PROBE: NEGATIVE
DIAGNOSIS: NEGATIVE
HPV: NOT DETECTED
Neisseria Gonorrhea: NEGATIVE

## 2019-03-01 ENCOUNTER — Encounter: Payer: Self-pay | Admitting: Family Medicine

## 2019-03-13 ENCOUNTER — Ambulatory Visit: Payer: 59 | Admitting: Family Medicine

## 2019-03-13 ENCOUNTER — Other Ambulatory Visit: Payer: Self-pay

## 2019-03-13 ENCOUNTER — Encounter: Payer: Self-pay | Admitting: Family Medicine

## 2019-03-13 DIAGNOSIS — R682 Dry mouth, unspecified: Secondary | ICD-10-CM

## 2019-03-13 NOTE — Patient Instructions (Signed)
You may be dehydrated at times Aim for 64 oz of fluids per day-mostly water  Try to cut back a bit on caffeine   If you exercise- get fluids before and after   Watch for painful/swollen joints or skin change or a lump in your neck  Pain or swelling in cheeks as well   Avoid antihistamines and a lot of lozenges   If no further improvement- we can do some labwork

## 2019-03-13 NOTE — Progress Notes (Signed)
Subjective:    Patient ID: Christine Carr, female    DOB: 1995-10-06, 23 y.o.   MRN: [id number]  HPI Pt presents with c/o mouth dryness  Wt Readings from Last 3 Encounters:  03/13/19 131 lb 9 oz (59.7 kg)  08/01/18 133 lb 12 oz (60.7 kg)  06/16/18 136 lb 4 oz (61.8 kg)  wt is down 5 lb since fall  Works 10:30 to 9 and usually does not eat much at work until she gets home Drinks some coffee  21.07 kg/m   2 weeks ago she felt very dry in mouth and back of throat Was tested for covid - neg  Thought she had uri  Took nyquil and alka selzer cold  Now it is not as bad   No sore throat  Usually does not have bad allergies  Does occ drink hot beverages  No trouble with taste or smell   No pnd or sniffles  No ear pain  Eyes are not dry   No joint swelling or pain  No rash  Skin is not overly dry except around mouth- this happens randomly   She does not drink enough fluids  Exercise- at home - uses resistance bands - U tube work outs  No outdoor work No sweating   She does get breaks at work  Has air conditioning (at [employer] and she does talk a lot)   No fam hx of autoimmune dz   No n/v/d  No appetite change    Never smoker   Takes oc junel 1/20   Patient Active Problem List   Diagnosis Date Noted  . Dry mouth 03/13/2019  . Encounter for screening mammogram for breast cancer 08/01/2018  . Encounter for annual routine gynecological examination 08/01/2018  . Skin lesion 06/16/2018  . Sore throat 02/23/2018  . Routine general medical examination at a health care facility 05/28/2017  . Fibrocystic breast changes of both breasts 11/21/2013  . Encounter for other general counseling or advice on contraception 06/12/2013   Past Medical History:  Diagnosis Date  . Lump or mass in breast 2013   right    History reviewed. No pertinent surgical history. Social History   Tobacco Use  . Smoking status: Never Smoker  . Smokeless tobacco: Never Used   Substance Use Topics  . Alcohol use: Yes    Alcohol/week: 0.0 standard drinks    Comment: socially  . Drug use: No   Family History  Problem Relation Age of Onset  . Lung cancer Maternal Aunt   . CAD Maternal Uncle    No Known Allergies Current Outpatient Medications on File Prior to Visit  Medication Sig Dispense Refill  . norethindrone-ethinyl estradiol (JUNEL 1/20) 1-20 MG-MCG tablet Take 1 tablet by mouth daily. 21 tablet 11   No current facility-administered medications on file prior to visit.      Review of Systems  Constitutional: Negative for activity change, appetite change, fatigue, fever and unexpected weight change.  HENT: Negative for congestion, ear pain, rhinorrhea, sinus pressure and sore throat.   Eyes: Negative for pain, redness and visual disturbance.  Respiratory: Negative for cough, shortness of breath and wheezing.   Cardiovascular: Negative for chest pain and palpitations.  Gastrointestinal: Negative for abdominal pain, blood in stool, constipation and diarrhea.  Endocrine: Negative for polydipsia and polyuria.  Genitourinary: Negative for dysuria, frequency and urgency.  Musculoskeletal: Negative for arthralgias, back pain and myalgias.  Skin: Negative for pallor and rash.  Allergic/Immunologic: Negative for environmental  allergies.  Neurological: Negative for dizziness, syncope and headaches.  Hematological: Negative for adenopathy. Does not bruise/bleed easily.  Psychiatric/Behavioral: Negative for decreased concentration and dysphoric mood. The patient is not nervous/anxious.        Objective:   Physical Exam Constitutional:      General: She is not in acute distress.    Appearance: Normal appearance. She is well-developed and normal weight. She is not ill-appearing or diaphoretic.     Comments: slim  HENT:     Head: Normocephalic and atraumatic.     Right Ear: Tympanic membrane and ear canal normal.     Left Ear: Tympanic membrane and ear  canal normal.     Nose: Nose normal. No congestion or rhinorrhea.     Mouth/Throat:     Mouth: Mucous membranes are moist.     Pharynx: Oropharynx is clear. No oropharyngeal exudate or posterior oropharyngeal erythema.     Comments: No pnd Eyes:     General: No scleral icterus.       Right eye: No discharge.        Left eye: No discharge.     Extraocular Movements: Extraocular movements intact.     Conjunctiva/sclera: Conjunctivae normal.     Pupils: Pupils are equal, round, and reactive to light.     Comments: Eyes do not appear dry  Neck:     Musculoskeletal: Normal range of motion and neck supple. No neck rigidity or muscular tenderness.     Thyroid: No thyromegaly.     Vascular: No carotid bruit or JVD.     Comments: No goiter or thyroid tenderness Cardiovascular:     Rate and Rhythm: Normal rate and regular rhythm.     Heart sounds: Normal heart sounds. No gallop.   Pulmonary:     Effort: Pulmonary effort is normal. No respiratory distress.     Breath sounds: Normal breath sounds. No wheezing or rales.  Abdominal:     General: Bowel sounds are normal. There is no distension or abdominal bruit.     Palpations: Abdomen is soft.  Musculoskeletal:     Right lower leg: No edema.     Left lower leg: No edema.  Lymphadenopathy:     Cervical: No cervical adenopathy.  Skin:    General: Skin is warm and dry.     Findings: No rash.  Neurological:     Mental Status: She is alert.     Cranial Nerves: No cranial nerve deficit.     Deep Tendon Reflexes: Reflexes are normal and symmetric. Reflexes normal.  Psychiatric:        Mood and Affect: Mood normal.           Assessment & Plan:   Problem List Items Addressed This Visit      Digestive   Dry mouth    Now resolving on its own Nl exam  Works all day-little fluid No fam hx of autoimmune conditions-but mother does have a thyroid issue   Suspect relative dehydration Disc goal of 64 oz of fluids daily  Minimize  caffeine/hot liquids  Offered to do labs-(thyroid/auto immune) but pt chooses to hold off since symptoms are improving  Will update [place]

## 2019-03-13 NOTE — Assessment & Plan Note (Signed)
Now resolving on its own Nl exam  Works all day-little fluid No fam hx of autoimmune conditions-but mother does have a thyroid issue   Suspect relative dehydration Disc goal of 64 oz of fluids daily  Minimize caffeine/hot liquids  Offered to do labs-(thyroid/auto immune) but pt chooses to hold off since symptoms are improving  Will update [place]

## 2019-06-20 ENCOUNTER — Ambulatory Visit (INDEPENDENT_AMBULATORY_CARE_PROVIDER_SITE_OTHER): Payer: 59

## 2019-06-20 DIAGNOSIS — Z23 Encounter for immunization: Secondary | ICD-10-CM | POA: Diagnosis not present

## 2020-04-19 ENCOUNTER — Telehealth: Payer: Self-pay | Admitting: Family Medicine

## 2020-04-19 DIAGNOSIS — Z0000 Encounter for general adult medical examination without abnormal findings: Secondary | ICD-10-CM

## 2020-04-19 NOTE — Telephone Encounter (Signed)
-----   Message from Cloyd Stagers, RT sent at 04/16/2020  1:37 PM EDT ----- Regarding: Lab Orders for Monday 8.16.2021 Please place lab orders for Monday 8.16.2021, office visit for physical on Friday 8.20.2021 Thank you, Dyke Maes RT(R)

## 2020-04-22 ENCOUNTER — Other Ambulatory Visit (INDEPENDENT_AMBULATORY_CARE_PROVIDER_SITE_OTHER): Payer: 59

## 2020-04-22 ENCOUNTER — Other Ambulatory Visit: Payer: Self-pay

## 2020-04-22 DIAGNOSIS — Z0000 Encounter for general adult medical examination without abnormal findings: Secondary | ICD-10-CM

## 2020-04-22 LAB — CBC WITH DIFFERENTIAL/PLATELET
Basophils Absolute: 0.1 10*3/uL (ref 0.0–0.1)
Basophils Relative: 1.5 % (ref 0.0–3.0)
Eosinophils Absolute: 0.3 10*3/uL (ref 0.0–0.7)
Eosinophils Relative: 6.2 % — ABNORMAL HIGH (ref 0.0–5.0)
HCT: 32.8 % — ABNORMAL LOW (ref 36.0–46.0)
Hemoglobin: 10.4 g/dL — ABNORMAL LOW (ref 12.0–15.0)
Lymphocytes Relative: 39.8 % (ref 12.0–46.0)
Lymphs Abs: 2.1 10*3/uL (ref 0.7–4.0)
MCHC: 31.6 g/dL (ref 30.0–36.0)
MCV: 74.4 fl — ABNORMAL LOW (ref 78.0–100.0)
Monocytes Absolute: 0.4 10*3/uL (ref 0.1–1.0)
Monocytes Relative: 7.9 % (ref 3.0–12.0)
Neutro Abs: 2.3 10*3/uL (ref 1.4–7.7)
Neutrophils Relative %: 44.6 % (ref 43.0–77.0)
Platelets: 519 10*3/uL — ABNORMAL HIGH (ref 150.0–400.0)
RBC: 4.4 Mil/uL (ref 3.87–5.11)
RDW: 18.5 % — ABNORMAL HIGH (ref 11.5–15.5)
WBC: 5.3 10*3/uL (ref 4.0–10.5)

## 2020-04-22 LAB — LIPID PANEL
Cholesterol: 148 mg/dL (ref 0–200)
HDL: 68.4 mg/dL (ref >39.00)
LDL Cholesterol: 69 mg/dL (ref 0–99)
NonHDL: 79.12
Total CHOL/HDL Ratio: 2
Triglycerides: 50 mg/dL (ref 0.0–149.0)
VLDL: 10 mg/dL (ref 0.0–40.0)

## 2020-04-22 LAB — COMPREHENSIVE METABOLIC PANEL
ALT: 8 U/L (ref 0–35)
AST: 14 U/L (ref 0–37)
Albumin: 4.1 g/dL (ref 3.5–5.2)
Alkaline Phosphatase: 47 U/L (ref 39–117)
BUN: 18 mg/dL (ref 6–23)
CO2: 23 mEq/L (ref 19–32)
Calcium: 9.3 mg/dL (ref 8.4–10.5)
Chloride: 104 mEq/L (ref 96–112)
Creatinine, Ser: 0.78 mg/dL (ref 0.40–1.20)
GFR: 109.87 mL/min (ref >60.00)
Glucose, Bld: 86 mg/dL (ref 70–99)
Potassium: 4.4 mEq/L (ref 3.5–5.1)
Sodium: 136 mEq/L (ref 135–145)
Total Bilirubin: 0.5 mg/dL (ref 0.2–1.2)
Total Protein: 7.7 g/dL (ref 6.0–8.3)

## 2020-04-22 LAB — TSH: TSH: 1 u[IU]/mL (ref 0.35–4.50)

## 2020-04-23 ENCOUNTER — Other Ambulatory Visit: Payer: 59

## 2020-04-26 ENCOUNTER — Other Ambulatory Visit: Payer: Self-pay

## 2020-04-26 ENCOUNTER — Ambulatory Visit (INDEPENDENT_AMBULATORY_CARE_PROVIDER_SITE_OTHER): Payer: 59 | Admitting: Family Medicine

## 2020-04-26 ENCOUNTER — Encounter: Payer: Self-pay | Admitting: Family Medicine

## 2020-04-26 VITALS — BP 104/68 | HR 85 | Temp 97.7°F | Ht 66.5 in | Wt 136.4 lb

## 2020-04-26 DIAGNOSIS — D500 Iron deficiency anemia secondary to blood loss (chronic): Secondary | ICD-10-CM

## 2020-04-26 DIAGNOSIS — Z0000 Encounter for general adult medical examination without abnormal findings: Secondary | ICD-10-CM

## 2020-04-26 MED ORDER — POLYSACCHARIDE IRON COMPLEX 150 MG PO CAPS: 150.0000 mg | ORAL_CAPSULE | Freq: Every day | ORAL | 5 refills | Status: DC

## 2020-04-26 NOTE — Progress Notes (Signed)
Subjective:    Patient ID: Christine Carr, female    DOB: [birth year], 24 y.o.   MRN: [id number]  This visit occurred during the SARS-CoV-2 public health emergency.  Safety protocols were in place, including screening questions prior to the visit, additional usage of staff PPE, and extensive cleaning of exam room while observing appropriate contact time as indicated for disinfecting solutions.    HPI Here for health maintenance exam and to review chronic medical problems    Wt Readings from Last 3 Encounters:  04/26/20 136 lb 7 oz (61.9 kg)  03/13/19 131 lb 9 oz (59.7 kg)  08/01/18 133 lb 12 oz (60.7 kg)   21.69 kg/m   Very busy  Work Emergency planning/management officer  Took summer course in July and in year 1 for Masters in nutrition  Also full time work   She has taken fair care of herself  Not enough sleep  Some caffeine   No time to exercise  Likes to work with a trainer/do cardio    covid status - she has had the vaccine Therapist, music)   Flu shot - she will get in the fall   Pap 11/19 with neg hpv and std screen  Self breast exam - has not done one in a while but no lumps  Off of junel, not trying to get pregnant / was having side effects  Not sexually active Periods are regular -heavy / some cramps lasting a week   Is fatigued (anemic on labs today) Is also cold natured    Tdap 9/18   Scheduled first gyn exam at [facility] next month -will disc contraception    Labs  Results for orders placed or performed in visit on 04/22/20  TSH  Result Value Ref Range   TSH 1.00 0.35 - 4.50 uIU/mL  Lipid panel  Result Value Ref Range   Cholesterol 148 0 - 200 mg/dL   Triglycerides 50.0 0 - 149 mg/dL   HDL 68.40 >39.00 mg/dL   VLDL 10.0 0.0 - 40.0 mg/dL   LDL Cholesterol 69 0 - 99 mg/dL   Total CHOL/HDL Ratio 2    NonHDL 79.12   CBC with Differential/Platelet  Result Value Ref Range   WBC 5.3 4.0 - 10.5 K/uL   RBC 4.40 3.87 - 5.11 Mil/uL   Hemoglobin 10.4 (L) 12.0 - 15.0 g/dL   HCT 32.8 (L) 36  - 46 %   MCV 74.4 (L) 78.0 - 100.0 fl   MCHC 31.6 30.0 - 36.0 g/dL   RDW 18.5 (H) 11.5 - 15.5 %   Platelets 519.0 (H) 150 - 400 K/uL   Neutrophils Relative % 44.6 43 - 77 %   Lymphocytes Relative 39.8 12 - 46 %   Monocytes Relative 7.9 3 - 12 %   Eosinophils Relative 6.2 (H) 0 - 5 %   Basophils Relative 1.5 0 - 3 %   Neutro Abs 2.3 1.4 - 7.7 K/uL   Lymphs Abs 2.1 0.7 - 4.0 K/uL   Monocytes Absolute 0.4 0 - 1 K/uL   Eosinophils Absolute 0.3 0 - 0 K/uL   Basophils Absolute 0.1 0 - 0 K/uL  Comprehensive metabolic panel  Result Value Ref Range   Sodium 136 135 - 145 mEq/L   Potassium 4.4 3.5 - 5.1 mEq/L   Chloride 104 96 - 112 mEq/L   CO2 23 19 - 32 mEq/L   Glucose, Bld 86 70 - 99 mg/dL   BUN 18 6 - 23 mg/dL   Creatinine,  Ser 0.78 0.40 - 1.20 mg/dL   Total Bilirubin 0.5 0.2 - 1.2 mg/dL   Alkaline Phosphatase 47 39 - 117 U/L   AST 14 0 - 37 U/L   ALT 8 0 - 35 U/L   Total Protein 7.7 6.0 - 8.3 g/dL   Albumin 4.1 3.5 - 5.2 g/dL   GFR 109.87 >60.00 mL/min   Calcium 9.3 8.4 - 10.5 mg/dL    Looks like iron def  High platelets  From above most likely   Patient Active Problem List   Diagnosis Date Noted   Dry mouth 03/13/2019   Encounter for screening mammogram for breast cancer 08/01/2018   Encounter for annual routine gynecological examination 08/01/2018   Skin lesion 06/16/2018   Sore throat 02/23/2018   Routine general medical examination at a health care facility 05/28/2017   Fibrocystic breast changes of both breasts 11/21/2013   Encounter for other general counseling or advice on contraception 06/12/2013   Past Medical History:  Diagnosis Date   Lump or mass in breast 2013   right    No past surgical history on file. Social History   Tobacco Use   Smoking status: Never Smoker   Smokeless tobacco: Never Used  Substance Use Topics   Alcohol use: Yes    Alcohol/week: 0.0 standard drinks    Comment: socially   Drug use: No   Family History    Problem Relation Age of Onset   Lung cancer Maternal Aunt    CAD Maternal Uncle    No Known Allergies Current Outpatient Medications on File Prior to Visit  Medication Sig Dispense Refill   acetic acid 5 % SOLN Apply 1 application topically.     tretinoin (RETIN-A) 0.1 % cream Apply 1 application topically every other day.     No current facility-administered medications on file prior to visit.    Review of Systems  Constitutional: Negative for activity change, appetite change, fatigue, fever and unexpected weight change.  HENT: Negative for congestion, ear pain, rhinorrhea, sinus pressure and sore throat.   Eyes: Negative for pain, redness and visual disturbance.  Respiratory: Negative for cough, shortness of breath and wheezing.   Cardiovascular: Negative for chest pain and palpitations.  Gastrointestinal: Negative for abdominal pain, blood in stool, constipation and diarrhea.  Endocrine: Negative for polydipsia and polyuria.  Genitourinary: Negative for dysuria, frequency and urgency.  Musculoskeletal: Negative for arthralgias, back pain and myalgias.  Skin: Negative for pallor and rash.  Allergic/Immunologic: Negative for environmental allergies.  Neurological: Negative for dizziness, syncope and headaches.  Hematological: Negative for adenopathy. Does not bruise/bleed easily.  Psychiatric/Behavioral: Negative for decreased concentration and dysphoric mood. The patient is not nervous/anxious.        Objective:   Physical Exam Constitutional:      General: She is not in acute distress.    Appearance: Normal appearance. She is well-developed and normal weight. She is not ill-appearing or diaphoretic.  HENT:     Head: Normocephalic and atraumatic.     Right Ear: Tympanic membrane, ear canal and external ear normal.     Left Ear: Tympanic membrane, ear canal and external ear normal.     Nose: Nose normal. No congestion.     Mouth/Throat:     Mouth: Mucous membranes are  moist.     Pharynx: Oropharynx is clear. No posterior oropharyngeal erythema.  Eyes:     General: No scleral icterus.    Extraocular Movements: Extraocular movements intact.  Conjunctiva/sclera: Conjunctivae normal.     Pupils: Pupils are equal, round, and reactive to light.  Neck:     Thyroid: No thyromegaly.     Vascular: No carotid bruit or JVD.  Cardiovascular:     Rate and Rhythm: Normal rate and regular rhythm.     Pulses: Normal pulses.     Heart sounds: Normal heart sounds. No gallop.   Pulmonary:     Effort: Pulmonary effort is normal. No respiratory distress.     Breath sounds: Normal breath sounds. No wheezing.     Comments: Good air exch Chest:     Chest wall: No tenderness.  Abdominal:     General: Bowel sounds are normal. There is no distension or abdominal bruit.     Palpations: Abdomen is soft. There is no mass.     Tenderness: There is no abdominal tenderness.     Hernia: No hernia is present.  Genitourinary:    Comments: Breast and gyn exam done by gyn  Musculoskeletal:        General: No tenderness. Normal range of motion.     Cervical back: Normal range of motion and neck supple. No rigidity. No muscular tenderness.     Right lower leg: No edema.     Left lower leg: No edema.  Lymphadenopathy:     Cervical: No cervical adenopathy.  Skin:    General: Skin is warm and dry.     Coloration: Skin is not pale.     Findings: No erythema or rash.     Comments: Few tags  Neurological:     Mental Status: She is alert. Mental status is at baseline.     Cranial Nerves: No cranial nerve deficit.     Motor: No abnormal muscle tone.     Coordination: Coordination normal.     Gait: Gait normal.     Deep Tendon Reflexes: Reflexes are normal and symmetric. Reflexes normal.  Psychiatric:        Mood and Affect: Mood normal.     Comments: Pleasant and cheerful           Assessment & Plan:   Problem List Items Addressed This Visit      Other   Routine  general medical examination at a health care facility - Primary    Reviewed health habits including diet and exercise and skin cancer prevention Reviewed appropriate screening tests for age  Also reviewed health mt list, fam hx and immunization status , as well as social and family history   See HPI Labs reviewed  recommend a flu shot in the fall  Encouraged self care  Pap utd- also has gyn f/u  Some anemia from heavy menses-will start niferex        Iron deficiency anemia    Suspect due to heavy menses  Hb of 10.4 Platelets high also -may be from iron def  Will start on niferex and plan lab re check  She will also d/w her gyn       Relevant Medications   iron polysaccharides (NIFEREX) 150 MG capsule

## 2020-04-26 NOTE — Patient Instructions (Addendum)
Get your flu shot in the fall   I think your periods are causing some anemia  Start niferex  It may help energy  If you get constipation -get a stool softener like colace and take regularly   Stop at check out and schedule labs for 2 months   Follow up with gyn as planned   Be safe

## 2020-04-28 DIAGNOSIS — D509 Iron deficiency anemia, unspecified: Secondary | ICD-10-CM | POA: Insufficient documentation

## 2020-04-28 NOTE — Assessment & Plan Note (Signed)
Reviewed health habits including diet and exercise and skin cancer prevention Reviewed appropriate screening tests for age  Also reviewed health mt list, fam hx and immunization status , as well as social and family history   See HPI Labs reviewed  recommend a flu shot in the fall  Encouraged self care  Pap utd- also has gyn f/u  Some anemia from heavy menses-will start niferex

## 2020-04-28 NOTE — Assessment & Plan Note (Signed)
Suspect due to heavy menses  Hb of 10.4 Platelets high also -may be from iron def  Will start on niferex and plan lab re check  She will also d/w her gyn

## 2020-06-21 ENCOUNTER — Telehealth: Payer: Self-pay

## 2020-06-21 NOTE — Telephone Encounter (Signed)
I will see her then.  If symptoms worsen may need to be seen by first available sooner

## 2020-06-21 NOTE — Telephone Encounter (Addendum)
Raquel Sarna at front desk mgr said pt sent mychart appt for abd pain that started on 06/16/20. Pt already has lab appt on 07/01/20 and wants office visit then. I called pt;  Unable to reach pt and left v/m requesting pt to call [facility] for additional info. Pt called back; pt has already scheduled appt on 07/12/20 at 3:30 with Dr Glori Bickers thru mychart;pt having more discomfort on rt side by her ribs; pain level 3.;pt started menstrual cycle on 06/16/20 and has nausea but that is usual with MP. No fever; pt said she tried to schedule appt on an off work day and DR Glori Bickers is off on 07/01/20; offered appt with different provider but pt declined. Pt will cb if condition changes or worsens. UC & ED precautions given and pt voiced understanding.Pt has no covid symptoms, no travel and no known exposure to + covid. FYI to Dr Glori Bickers.

## 2020-06-24 NOTE — Telephone Encounter (Signed)
Front desk brought me note that pt had scheduled appt with Dr Glori Bickers on 06/26/20 at 8 AM thru my chart.  I called pt and left v/m requesting cb by pt.

## 2020-06-24 NOTE — Telephone Encounter (Signed)
I will see her then  

## 2020-06-24 NOTE — Telephone Encounter (Signed)
I spoke with pt; pt said that she got some extra time off work and decided to see Dr Glori Bickers sooner than 07/12/20 appt pt already scheduled. 07/12/20 appt cancelled per pt request. Pt said no fever and no diarrhea or constipation and no vomiting but pt does have nausea feeling. Pt said the rt sided lower abd discomfort is no longer a pain but more of a bloated feeling that comes and goes. Pt will keep appt she scheduled on 06/26/20 at 8 AM with Dr Glori Bickers. UC & ED precautions given and pt voiced understanding. Had offered appt with different provider for 06/25/20 but pt prefers to see Dr Glori Bickers.

## 2020-06-26 ENCOUNTER — Other Ambulatory Visit: Payer: Self-pay

## 2020-06-26 ENCOUNTER — Encounter: Payer: Self-pay | Admitting: Family Medicine

## 2020-06-26 ENCOUNTER — Ambulatory Visit (INDEPENDENT_AMBULATORY_CARE_PROVIDER_SITE_OTHER): Payer: 59 | Admitting: Family Medicine

## 2020-06-26 VITALS — BP 132/64 | HR 83 | Temp 97.4°F | Ht 66.5 in | Wt 139.1 lb

## 2020-06-26 DIAGNOSIS — R1084 Generalized abdominal pain: Secondary | ICD-10-CM | POA: Insufficient documentation

## 2020-06-26 DIAGNOSIS — R8290 Unspecified abnormal findings in urine: Secondary | ICD-10-CM

## 2020-06-26 DIAGNOSIS — R1031 Right lower quadrant pain: Secondary | ICD-10-CM | POA: Diagnosis not present

## 2020-06-26 DIAGNOSIS — D500 Iron deficiency anemia secondary to blood loss (chronic): Secondary | ICD-10-CM

## 2020-06-26 DIAGNOSIS — R109 Unspecified abdominal pain: Secondary | ICD-10-CM | POA: Insufficient documentation

## 2020-06-26 DIAGNOSIS — R1030 Lower abdominal pain, unspecified: Secondary | ICD-10-CM

## 2020-06-26 LAB — CBC WITH DIFFERENTIAL/PLATELET
Basophils Absolute: 0.1 10*3/uL (ref 0.0–0.1)
Basophils Relative: 3.4 % — ABNORMAL HIGH (ref 0.0–3.0)
Eosinophils Absolute: 0.2 10*3/uL (ref 0.0–0.7)
Eosinophils Relative: 4.4 % (ref 0.0–5.0)
HCT: 31.7 % — ABNORMAL LOW (ref 36.0–46.0)
Hemoglobin: 10 g/dL — ABNORMAL LOW (ref 12.0–15.0)
Lymphocytes Relative: 36.7 % (ref 12.0–46.0)
Lymphs Abs: 1.4 10*3/uL (ref 0.7–4.0)
MCHC: 31.4 g/dL (ref 30.0–36.0)
MCV: 72.8 fl — ABNORMAL LOW (ref 78.0–100.0)
Monocytes Absolute: 0.4 10*3/uL (ref 0.1–1.0)
Monocytes Relative: 9.3 % (ref 3.0–12.0)
Neutro Abs: 1.8 10*3/uL (ref 1.4–7.7)
Neutrophils Relative %: 46.2 % (ref 43.0–77.0)
Platelets: 618 10*3/uL — ABNORMAL HIGH (ref 150.0–400.0)
RBC: 4.35 Mil/uL (ref 3.87–5.11)
RDW: 17.6 % — ABNORMAL HIGH (ref 11.5–15.5)
WBC: 3.9 10*3/uL — ABNORMAL LOW (ref 4.0–10.5)

## 2020-06-26 LAB — POC URINALSYSI DIPSTICK (AUTOMATED)
Bilirubin, UA: 1
Blood, UA: 80
Glucose, UA: NEGATIVE
Nitrite, UA: NEGATIVE
Protein, UA: POSITIVE — AB
Spec Grav, UA: 1.02 (ref 1.010–1.025)
Urobilinogen, UA: 0.2 E.U./dL
pH, UA: 7 (ref 5.0–8.0)

## 2020-06-26 LAB — HEPATIC FUNCTION PANEL
ALT: 8 U/L (ref 0–35)
AST: 14 U/L (ref 0–37)
Albumin: 4 g/dL (ref 3.5–5.2)
Alkaline Phosphatase: 44 U/L (ref 39–117)
Bilirubin, Direct: 0.1 mg/dL (ref 0.0–0.3)
Total Bilirubin: 0.4 mg/dL (ref 0.2–1.2)
Total Protein: 7.5 g/dL (ref 6.0–8.3)

## 2020-06-26 LAB — BASIC METABOLIC PANEL
BUN: 17 mg/dL (ref 6–23)
CO2: 27 mEq/L (ref 19–32)
Calcium: 9.5 mg/dL (ref 8.4–10.5)
Chloride: 102 mEq/L (ref 96–112)
Creatinine, Ser: 0.83 mg/dL (ref 0.40–1.20)
GFR: 98.64 mL/min (ref >60.00)
Glucose, Bld: 80 mg/dL (ref 70–99)
Potassium: 4.3 mEq/L (ref 3.5–5.1)
Sodium: 135 mEq/L (ref 135–145)

## 2020-06-26 LAB — FERRITIN: Ferritin: 3.1 ng/mL — ABNORMAL LOW (ref 10.0–291.0)

## 2020-06-26 LAB — IRON: Iron: 19 ug/dL — ABNORMAL LOW (ref 42–145)

## 2020-06-26 LAB — LIPASE: Lipase: 21 U/L (ref 11.0–59.0)

## 2020-06-26 NOTE — Progress Notes (Signed)
Subjective:    Patient ID: Christine Carr, female    DOB: June 29, 1996, 24 y.o.   MRN: [id number]  This visit occurred during the SARS-CoV-2 public health emergency.  Safety protocols were in place, including screening questions prior to the visit, additional usage of staff PPE, and extensive cleaning of exam room while observing appropriate contact time as indicated for disinfecting solutions.    HPI Pt presents with low abd pain   Wt Readings from Last 3 Encounters:  06/26/20 139 lb 1 oz (63.1 kg)  04/26/20 136 lb 7 oz (61.9 kg)  03/13/19 131 lb 9 oz (59.7 kg)   22.11 kg/m  Symptoms started Oct 10  Pressure on R side of low abd/pelvis -- not bad/ like a "ting" Also having her period and bloating   Pain does not inc with movement or exercise Not tender when she presses on it  Some nausea- unsure if related to this or new pill or because she is having period  Middle of period right now (not too heavy)   She stopped the iron due to GI intolerance - recently   No diarrhea or constipation  No change in gas  No diet changes   No regular exercise     Started after she started the OC (nikki 3-0.02) 2 weeks ago  Seeing a gyn    Had her pap test- was abnormal and has to go back for colp  Periods are much lighter   Had STD screening and neg for gc/chlam  Also neg for other things   No urinary symptoms    UA: Results for orders placed or performed in visit on 06/26/20  POCT Urinalysis Dipstick (Automated)  Result Value Ref Range   Color, UA Yellow    Clarity, UA Hazy    Glucose, UA Negative Negative   Bilirubin, UA 1 mg/dL    Ketones, UA Trace    Spec Grav, UA 1.020 1.010 - 1.025   Blood, UA 80 Ery/uL    pH, UA 7.0 5.0 - 8.0   Protein, UA Positive (A) Negative   Urobilinogen, UA 0.2 0.2 or 1.0 E.U./dL   Nitrite, UA Negative    Leukocytes, UA Trace (A) Negative    H/o anemia from heavy menses   Goes to [employer] and sees Ivin Poot  She  has been stressed lately with school and work   Patient Active Problem List   Diagnosis Date Noted   Abdominal pain 06/26/2020   Abnormal urinalysis 06/26/2020   Iron deficiency anemia 04/28/2020   Dry mouth 03/13/2019   Encounter for screening mammogram for breast cancer 08/01/2018   Encounter for annual routine gynecological examination 08/01/2018   Skin lesion 06/16/2018   Sore throat 02/23/2018   Routine general medical examination at a health care facility 05/28/2017   Fibrocystic breast changes of both breasts 11/21/2013   Encounter for other general counseling or advice on contraception 06/12/2013   Past Medical History:  Diagnosis Date   Lump or mass in breast 2013   right    History reviewed. No pertinent surgical history. Social History   Tobacco Use   Smoking status: Never Smoker   Smokeless tobacco: Never Used  Substance Use Topics   Alcohol use: Yes    Alcohol/week: 0.0 standard drinks    Comment: socially   Drug use: No   Family History  Problem Relation Age of Onset   Lung cancer Maternal Aunt    CAD Maternal Uncle  No Known Allergies Current Outpatient Medications on File Prior to Visit  Medication Sig Dispense Refill   acetic acid 5 % SOLN Apply 1 application topically.     iron polysaccharides (NIFEREX) 150 MG capsule Take 1 capsule (150 mg total) by mouth daily. 30 capsule 5   NIKKI 3-0.02 MG tablet Take 1 tablet by mouth daily.     tretinoin (RETIN-A) 0.1 % cream Apply 1 application topically every other day.     No current facility-administered medications on file prior to visit.     Review of Systems  Constitutional: Negative for activity change, appetite change, fatigue, fever and unexpected weight change.  HENT: Negative for congestion, ear pain, rhinorrhea, sinus pressure and sore throat.   Eyes: Negative for pain, redness and visual disturbance.  Respiratory: Negative for cough, shortness of breath and wheezing.     Cardiovascular: Negative for chest pain and palpitations.  Gastrointestinal: Positive for abdominal pain and nausea. Negative for abdominal distention, anal bleeding, blood in stool, constipation, diarrhea, rectal pain and vomiting.  Endocrine: Negative for polydipsia and polyuria.  Genitourinary: Negative for dysuria, frequency and urgency.  Musculoskeletal: Negative for arthralgias, back pain and myalgias.  Skin: Negative for pallor and rash.  Allergic/Immunologic: Negative for environmental allergies.  Neurological: Negative for dizziness, syncope and headaches.  Hematological: Negative for adenopathy. Does not bruise/bleed easily.  Psychiatric/Behavioral: Negative for decreased concentration and dysphoric mood. The patient is nervous/anxious.        Objective:   Physical Exam Constitutional:      General: She is not in acute distress.    Appearance: Normal appearance. She is normal weight. She is not ill-appearing or diaphoretic.  HENT:     Head: Normocephalic and atraumatic.     Mouth/Throat:     Mouth: Mucous membranes are moist.     Pharynx: Oropharynx is clear.  Eyes:     General: No scleral icterus.    Conjunctiva/sclera: Conjunctivae normal.     Pupils: Pupils are equal, round, and reactive to light.  Cardiovascular:     Rate and Rhythm: Normal rate and regular rhythm.  Pulmonary:     Effort: Pulmonary effort is normal. No respiratory distress.     Breath sounds: Normal breath sounds.  Abdominal:     General: Abdomen is flat. Bowel sounds are normal. There is no distension or abdominal bruit. There are no signs of injury.     Palpations: Abdomen is soft. There is no hepatomegaly, splenomegaly, mass or pulsatile mass.     Tenderness: There is no abdominal tenderness. There is no right CVA tenderness, left CVA tenderness or guarding. Negative signs include Murphy's sign and McBurney's sign.     Hernia: No hernia is present.  Musculoskeletal:     Cervical back: Normal  range of motion and neck supple.  Lymphadenopathy:     Cervical: No cervical adenopathy.  Skin:    General: Skin is warm and dry.     Coloration: Skin is not pale.     Findings: No erythema or rash.  Neurological:     Mental Status: She is alert.     Sensory: No sensory deficit.  Psychiatric:        Mood and Affect: Mood normal.     Comments: Pt discusses stressors today that make her anxious            Assessment & Plan:   Problem List Items Addressed This Visit      Other   Iron deficiency anemia  Took iron up until a few wk ago Now on OC for period control (menses now- not as heavy)  Cbc and iron and ferritin today      Relevant Orders   CBC with Differential/Platelet   Iron   Ferritin   Abdominal pain - Primary    R lower-radiates up  Urine has leukocytes On menses and just started OC  No urinary symptoms or vaginal symptoms  Disc possible etiologies incl ovarian cyst or urinary infection  Benign exam and had recent gyn visit with pap (pending colp) and had neg STD tests  Labs today  Urine cx sent  Update from there inst to call if symptoms worsen or change in the meantime  Urine cx sent        Relevant Orders   POCT Urinalysis Dipstick (Automated) (Completed)   Urine Culture   CBC with Differential/Platelet   Basic metabolic panel   Hepatic function panel   Lipase   Abnormal urinalysis    With R lower abd pain  Sent for cx        Relevant Orders   Urine Culture

## 2020-06-26 NOTE — Assessment & Plan Note (Addendum)
R lower-radiates up  Urine has leukocytes On menses and just started OC  No urinary symptoms or vaginal symptoms  Disc possible etiologies incl ovarian cyst or urinary infection  Benign exam and had recent gyn visit with pap (pending colp) and had neg STD tests  Labs today  Urine cx sent  Update from there inst to call if symptoms worsen or change in the meantime  Urine cx sent

## 2020-06-26 NOTE — Patient Instructions (Signed)
Take care of yourself  Try a warm compress on the area and take ibuprofen  Labs today for abdominal pain and anemia Also urine culture   If worse or new symptoms please let us know

## 2020-06-26 NOTE — Assessment & Plan Note (Signed)
With R lower abd pain  Sent for cx

## 2020-06-26 NOTE — Assessment & Plan Note (Signed)
Took iron up until a few wk ago Now on OC for period control (menses now- not as heavy)  Cbc and iron and ferritin today

## 2020-06-27 LAB — URINE CULTURE
MICRO NUMBER:: 11096158
SPECIMEN QUALITY:: ADEQUATE

## 2020-07-01 ENCOUNTER — Other Ambulatory Visit: Payer: 59

## 2020-07-04 ENCOUNTER — Ambulatory Visit: Payer: 59 | Admitting: Family Medicine

## 2020-07-04 ENCOUNTER — Other Ambulatory Visit: Payer: Self-pay

## 2020-07-04 ENCOUNTER — Encounter: Payer: Self-pay | Admitting: Family Medicine

## 2020-07-04 ENCOUNTER — Ambulatory Visit (INDEPENDENT_AMBULATORY_CARE_PROVIDER_SITE_OTHER)
Admission: RE | Admit: 2020-07-04 | Discharge: 2020-07-04 | Disposition: A | Discharge status: Home / Self Care | Payer: 59 | Source: Ambulatory Visit | Attending: Family Medicine | Admitting: Family Medicine

## 2020-07-04 VITALS — BP 120/76 | HR 88 | Temp 97.7°F | Ht 66.5 in | Wt 140.0 lb

## 2020-07-04 DIAGNOSIS — R1031 Right lower quadrant pain: Secondary | ICD-10-CM | POA: Diagnosis not present

## 2020-07-04 DIAGNOSIS — D500 Iron deficiency anemia secondary to blood loss (chronic): Secondary | ICD-10-CM | POA: Diagnosis not present

## 2020-07-04 MED ORDER — OMEPRAZOLE 20 MG PO CPDR: 20.0000 mg | DELAYED_RELEASE_CAPSULE | Freq: Every day | ORAL | 3 refills | Status: DC

## 2020-07-04 NOTE — Assessment & Plan Note (Signed)
This became better then worse on Monday Vague abd discomfort-difficult to describe Now upper occ as well as lower, occ moves to the L  No stool changes or nausea  Still no tenderness on exam  Some mild constipation from iron  Took prilosec and it helped  Pt wonders if OC is causing this (did help her period)  Has appt with gyn on 11/5 for colp-will disc further then  Plan to check abd xray  Start omeprazole 20 mg daily  Then check in  Disc poss of [place] or CT if no imp/or GI ref  She will d/w gyn also

## 2020-07-04 NOTE — Assessment & Plan Note (Signed)
Last hb 10 Last period was improved (less heavy and long) with Yaz (however pt wonders if OC is causing her abd discomfort) Now taking niferex bid (tolerates)-some constipation  Will need to plan re check of labs

## 2020-07-04 NOTE — Progress Notes (Signed)
Subjective:    Patient ID: Christine Carr, female    DOB: 02/24/1996, 24 y.o.   MRN: [id number]  This visit occurred during the SARS-CoV-2 public health emergency.  Safety protocols were in place, including screening questions prior to the visit, additional usage of staff PPE, and extensive cleaning of exam room while observing appropriate contact time as indicated for disinfecting solutions.    HPI Pt presents for f/u of R lower abd pain   Wt Readings from Last 3 Encounters:  07/04/20 140 lb (63.5 kg)  06/26/20 139 lb 1 oz (63.1 kg)  04/26/20 136 lb 7 oz (61.9 kg)   22.26 kg/m  Symptoms have improved just a little  Worse Monday and then better again -she took anti acid med/ prilosec and that helped  Now is higher up  Some gassy feeling  No nausea- that is better (from menses)  Eating - ? If helps or hurts  Worse when she sits (sits for work)  Better when she stands   Pain - slt burning / tinge  No hunger pains  No pain to press on  occ goes low into pelvic area   Has colposcopy next Friday planned   Had menses last visit- was shorter  Only bled heavy for a few days -definitely improved  Did not start having the abd pain until she started the oral contraception     Seen recently  A/p Abdominal pain - Primary     R lower-radiates up  Urine has leukocytes On menses and just started OC  No urinary symptoms or vaginal symptoms  Disc possible etiologies incl ovarian cyst or urinary infection  Benign exam and had recent gyn visit with pap (pending colp) and had neg STD tests  Labs today  Urine cx sent  Update from there inst to call if symptoms worsen or change in the meantime  Urine cx sent    Last labs were 10/20  Lab Results  Component Value Date   CREATININE 0.83 06/26/2020   BUN 17 06/26/2020   NA 135 06/26/2020   K 4.3 06/26/2020   CL 102 06/26/2020   CO2 27 06/26/2020   Lab Results  Component Value Date   ALT 8 06/26/2020   AST 14 06/26/2020    ALKPHOS 44 06/26/2020   BILITOT 0.4 06/26/2020  glucose 80  Lab Results  Component Value Date   WBC 3.9 (L) 06/26/2020   HGB 10.0 (L) 06/26/2020   HCT 31.7 (L) 06/26/2020   MCV 72.8 (L) 06/26/2020   PLT 618.0 (H) 06/26/2020   Iron 19, ferritin 3.1 (iron def from menses)  We inc iron supplement to bid   Urine culture showed contamination   Taking nikki OC now  Gyn f/u   She takes aleve and tylenol on and off  Aleve does not make stomach hurt more  Patient Active Problem List   Diagnosis Date Noted  . Abdominal pain 06/26/2020  . Abnormal urinalysis 06/26/2020  . Iron deficiency anemia 04/28/2020  . Dry mouth 03/13/2019  . Encounter for screening mammogram for breast cancer 08/01/2018  . Encounter for annual routine gynecological examination 08/01/2018  . Skin lesion 06/16/2018  . Sore throat 02/23/2018  . Routine general medical examination at a health care facility 05/28/2017  . Fibrocystic breast changes of both breasts 11/21/2013  . Encounter for other general counseling or advice on contraception 06/12/2013   Past Medical History:  Diagnosis Date  . Lump or mass in breast 2013  right    History reviewed. No pertinent surgical history. Social History   Tobacco Use  . Smoking status: Never Smoker  . Smokeless tobacco: Never Used  Substance Use Topics  . Alcohol use: Yes    Alcohol/week: 0.0 standard drinks    Comment: socially  . Drug use: No   Family History  Problem Relation Age of Onset  . Lung cancer Maternal Aunt   . CAD Maternal Uncle    No Known Allergies Current Outpatient Medications on File Prior to Visit  Medication Sig Dispense Refill  . acetic acid 5 % SOLN Apply 1 application topically.    . iron polysaccharides (NIFEREX) 150 MG capsule Take 1 capsule (150 mg total) by mouth daily. 30 capsule 5  . NIKKI 3-0.02 MG tablet Take 1 tablet by mouth daily.    Marland Kitchen tretinoin (RETIN-A) 0.1 % cream Apply 1 application topically every other day.      No current facility-administered medications on file prior to visit.    Review of Systems  Constitutional: Negative for activity change, appetite change, fatigue, fever and unexpected weight change.  HENT: Negative for congestion, ear pain, rhinorrhea, sinus pressure and sore throat.   Eyes: Negative for pain, redness and visual disturbance.  Respiratory: Negative for cough, shortness of breath and wheezing.   Cardiovascular: Negative for chest pain and palpitations.  Gastrointestinal: Positive for abdominal pain and constipation. Negative for abdominal distention, anal bleeding, blood in stool, diarrhea, nausea, rectal pain and vomiting.  Endocrine: Negative for polydipsia and polyuria.  Genitourinary: Negative for dysuria, frequency and urgency.  Musculoskeletal: Negative for arthralgias, back pain and myalgias.  Skin: Negative for pallor and rash.  Allergic/Immunologic: Negative for environmental allergies.  Neurological: Negative for dizziness, syncope and headaches.  Hematological: Negative for adenopathy. Does not bruise/bleed easily.  Psychiatric/Behavioral: Negative for decreased concentration and dysphoric mood. The patient is not nervous/anxious.        Objective:   Physical Exam Constitutional:      General: She is not in acute distress.    Appearance: Normal appearance. She is well-developed and normal weight. She is not ill-appearing.  HENT:     Head: Normocephalic and atraumatic.  Eyes:     General: No scleral icterus.    Conjunctiva/sclera: Conjunctivae normal.     Pupils: Pupils are equal, round, and reactive to light.  Cardiovascular:     Rate and Rhythm: Normal rate and regular rhythm.     Pulses: Normal pulses.     Heart sounds: Normal heart sounds.  Pulmonary:     Effort: Pulmonary effort is normal. No respiratory distress.     Breath sounds: Normal breath sounds. No wheezing or rales.  Abdominal:     General: Bowel sounds are normal. There is no  distension.     Palpations: Abdomen is soft. There is no mass.     Tenderness: There is no abdominal tenderness. There is no right CVA tenderness, left CVA tenderness, guarding or rebound.     Hernia: No hernia is present.     Comments: No tenderness Some fullness suprapubic (but not tender)  Musculoskeletal:     Cervical back: Normal range of motion and neck supple.  Lymphadenopathy:     Cervical: No cervical adenopathy.  Skin:    General: Skin is warm and dry.     Coloration: Skin is not pale.     Findings: No erythema.  Neurological:     Mental Status: She is alert.     Sensory:  No sensory deficit.     Coordination: Coordination normal.     Deep Tendon Reflexes: Reflexes normal.  Psychiatric:        Mood and Affect: Mood normal.           Assessment & Plan:   Problem List Items Addressed This Visit      Other   Iron deficiency anemia    Last hb 10 Last period was improved (less heavy and long) with Yaz (however pt wonders if OC is causing her abd discomfort) Now taking niferex bid (tolerates)-some constipation  Will need to plan re check of labs       Abdominal pain - Primary    This became better then worse on Monday Vague abd discomfort-difficult to describe Now upper occ as well as lower, occ moves to the L  No stool changes or nausea  Still no tenderness on exam  Some mild constipation from iron  Took prilosec and it helped  Pt wonders if OC is causing this (did help her period)  Has appt with gyn on 11/5 for colp-will disc further then  Plan to check abd xray  Start omeprazole 20 mg daily  Then check in  Disc poss of [place] or CT if no imp/or GI ref  She will d/w gyn also        Relevant Orders   DG Abd 2 Views

## 2020-07-04 NOTE — Patient Instructions (Signed)
Follow up for your colposcopy and discuss your symptoms with the gyn provider   Xray now  We will call with results   Start prilosec 20 mg daily for now  We will make a plan based on progress with that

## 2020-07-08 ENCOUNTER — Encounter: Payer: Self-pay | Admitting: Family Medicine

## 2020-07-12 ENCOUNTER — Other Ambulatory Visit (INDEPENDENT_AMBULATORY_CARE_PROVIDER_SITE_OTHER): Payer: 59

## 2020-07-12 ENCOUNTER — Other Ambulatory Visit: Payer: Self-pay

## 2020-07-12 ENCOUNTER — Ambulatory Visit: Payer: 59 | Admitting: Family Medicine

## 2020-07-12 DIAGNOSIS — D500 Iron deficiency anemia secondary to blood loss (chronic): Secondary | ICD-10-CM | POA: Diagnosis not present

## 2020-07-12 NOTE — Addendum Note (Signed)
Addended by: Ellamae Sia on: 07/12/2020 03:20 PM   Modules accepted: Orders

## 2020-07-13 LAB — CBC WITH DIFFERENTIAL/PLATELET
Absolute Monocytes: 490 cells/uL (ref 200–950)
Basophils Absolute: 71 cells/uL (ref 0–200)
Basophils Relative: 1 %
Eosinophils Absolute: 199 cells/uL (ref 15–500)
Eosinophils Relative: 2.8 %
HCT: 32.8 % — ABNORMAL LOW (ref 35.0–45.0)
Hemoglobin: 9.9 g/dL — ABNORMAL LOW (ref 11.7–15.5)
Lymphs Abs: 2059 cells/uL (ref 850–3900)
MCH: 22.7 pg — ABNORMAL LOW (ref 27.0–33.0)
MCHC: 30.2 g/dL — ABNORMAL LOW (ref 32.0–36.0)
MCV: 75.1 fL — ABNORMAL LOW (ref 80.0–100.0)
MPV: 9.4 fL (ref 7.5–12.5)
Monocytes Relative: 6.9 %
Neutro Abs: 4281 cells/uL (ref 1500–7800)
Neutrophils Relative %: 60.3 %
Platelets: 511 10*3/uL — ABNORMAL HIGH (ref 140–400)
RBC: 4.37 10*6/uL (ref 3.80–5.10)
RDW: 16.8 % — ABNORMAL HIGH (ref 11.0–15.0)
Total Lymphocyte: 29 %
WBC: 7.1 10*3/uL (ref 3.8–10.8)

## 2020-07-13 LAB — FERRITIN: Ferritin: 4 ng/mL — ABNORMAL LOW (ref 16–154)

## 2020-07-13 LAB — IRON: Iron: 43 ug/dL (ref 40–190)

## 2020-07-15 ENCOUNTER — Encounter: Payer: Self-pay | Admitting: Family Medicine

## 2020-07-22 ENCOUNTER — Ambulatory Visit: Payer: 59 | Admitting: Family Medicine

## 2020-07-30 ENCOUNTER — Other Ambulatory Visit: Payer: Self-pay

## 2020-07-30 ENCOUNTER — Ambulatory Visit (INDEPENDENT_AMBULATORY_CARE_PROVIDER_SITE_OTHER): Payer: 59 | Admitting: Family Medicine

## 2020-07-30 ENCOUNTER — Encounter: Payer: Self-pay | Admitting: Family Medicine

## 2020-07-30 VITALS — BP 118/72 | HR 107 | Temp 97.2°F | Ht 66.5 in | Wt 140.0 lb

## 2020-07-30 DIAGNOSIS — D500 Iron deficiency anemia secondary to blood loss (chronic): Secondary | ICD-10-CM | POA: Diagnosis not present

## 2020-07-30 DIAGNOSIS — R1084 Generalized abdominal pain: Secondary | ICD-10-CM

## 2020-07-30 MED ORDER — POLYSACCHARIDE IRON COMPLEX 150 MG PO CAPS
150.0000 mg | ORAL_CAPSULE | Freq: Two times a day (BID) | ORAL | 5 refills | Status: DC
Start: 2020-07-30 — End: 2021-03-24

## 2020-07-30 MED ORDER — OMEPRAZOLE 20 MG PO CPDR
20.0000 mg | DELAYED_RELEASE_CAPSULE | Freq: Every day | ORAL | 4 refills | Status: DC
Start: 2020-07-30 — End: 2022-07-21

## 2020-07-30 NOTE — Patient Instructions (Addendum)
Take care of yourself   Continue the iron twice daily   Take the omeprazole daily for 3 more months  Then-if doing well , go down to every other day  After 2 weeks stop   Keep eating a healthy diet

## 2020-07-30 NOTE — Progress Notes (Signed)
Subjective:    Patient ID: Christine Carr, female    DOB: 1996-04-06, 24 y.o.   MRN: [id number]  This visit occurred during the SARS-CoV-2 public health emergency.  Safety protocols were in place, including screening questions prior to the visit, additional usage of staff PPE, and extensive cleaning of exam room while observing appropriate contact time as indicated for disinfecting solutions.    HPI Pt presents for f/u of chronic health problems   Wt Readings from Last 3 Encounters:  07/30/20 140 lb (63.5 kg)  07/04/20 140 lb (63.5 kg)  06/26/20 139 lb 1 oz (63.1 kg)   22.26 kg/m   Feeling better   Taking omeprazole for abdominal pain  Made a difference   Constipation - better  Has changed her diet-that helped  Getting fluids     Iron def anemia -from heavymenses Taking niferex 150 mg bid - she is tolerating  Lab Results  Component Value Date   WBC 7.1 07/12/2020   HGB 9.9 (L) 07/12/2020   HCT 32.8 (L) 07/12/2020   MCV 75.1 (L) 07/12/2020   PLT 511 (H) 07/12/2020   Iron 43 Ferritin 4   Taking nikki 3-0.02  Missed a few days and had to start a new pack  LMP nov 10th and ended on the 14th  (first day was heavy and then tapered off)  Went through 3 pads the first day (whole pad was not as saturated as usual)  Thinks the OC   No h/o sickle cell trait   Has appt on 12/27  Eating prunes, raisins and spinach  Also OJ for abs of the iron   Patient Active Problem List   Diagnosis Date Noted  . Abdominal pain, diffuse 06/26/2020  . Iron deficiency anemia 04/28/2020  . Dry mouth 03/13/2019  . Encounter for screening mammogram for breast cancer 08/01/2018  . Encounter for annual routine gynecological examination 08/01/2018  . Skin lesion 06/16/2018  . Routine general medical examination at a health care facility 05/28/2017  . Fibrocystic breast changes of both breasts 11/21/2013  . Encounter for other general counseling or advice on contraception 06/12/2013    Past Medical History:  Diagnosis Date  . Lump or mass in breast 2013   right    History reviewed. No pertinent surgical history. Social History   Tobacco Use  . Smoking status: Never Smoker  . Smokeless tobacco: Never Used  Substance Use Topics  . Alcohol use: Yes    Alcohol/week: 0.0 standard drinks    Comment: socially  . Drug use: No   Family History  Problem Relation Age of Onset  . Lung cancer Maternal Aunt   . CAD Maternal Uncle    No Known Allergies Current Outpatient Medications on File Prior to Visit  Medication Sig Dispense Refill  . acetic acid 5 % SOLN Apply 1 application topically.    Marland Kitchen NIKKI 3-0.02 MG tablet Take 1 tablet by mouth daily.    Marland Kitchen tretinoin (RETIN-A) 0.1 % cream Apply 1 application topically every other day.     No current facility-administered medications on file prior to visit.     Review of Systems  Constitutional: Positive for fatigue. Negative for activity change, appetite change, fever and unexpected weight change.  HENT: Negative for congestion, ear pain, rhinorrhea, sinus pressure and sore throat.   Eyes: Negative for pain, redness and visual disturbance.  Respiratory: Negative for cough, shortness of breath and wheezing.   Cardiovascular: Negative for chest pain and palpitations.  Gastrointestinal:  Negative for abdominal pain, blood in stool, constipation and diarrhea.  Endocrine: Negative for polydipsia and polyuria.  Genitourinary: Positive for menstrual problem. Negative for dysuria, frequency and urgency.  Musculoskeletal: Negative for arthralgias, back pain and myalgias.  Skin: Negative for pallor and rash.  Allergic/Immunologic: Negative for environmental allergies.  Neurological: Negative for dizziness, syncope and headaches.  Hematological: Negative for adenopathy. Does not bruise/bleed easily.  Psychiatric/Behavioral: Negative for decreased concentration and dysphoric mood. The patient is not nervous/anxious.         Objective:   Physical Exam Constitutional:      General: She is not in acute distress.    Appearance: Normal appearance. She is well-developed and normal weight. She is not ill-appearing.  HENT:     Head: Normocephalic and atraumatic.  Eyes:     Conjunctiva/sclera: Conjunctivae normal.     Pupils: Pupils are equal, round, and reactive to light.  Neck:     Thyroid: No thyromegaly.     Vascular: No carotid bruit or JVD.  Cardiovascular:     Rate and Rhythm: Regular rhythm. Tachycardia present.     Heart sounds: Normal heart sounds. No gallop.   Pulmonary:     Effort: Pulmonary effort is normal. No respiratory distress.     Breath sounds: Normal breath sounds. No wheezing or rales.  Abdominal:     General: Bowel sounds are normal. There is no distension or abdominal bruit.     Palpations: Abdomen is soft. There is no mass.     Tenderness: There is no abdominal tenderness. There is no guarding or rebound.     Hernia: No hernia is present.     Comments: No tenderness today  Uterus feels enlarged/ poss fibroid uterus    Musculoskeletal:     Cervical back: Normal range of motion and neck supple.  Lymphadenopathy:     Cervical: No cervical adenopathy.  Skin:    General: Skin is warm and dry.     Coloration: Skin is not jaundiced or pale.     Findings: No erythema or rash.  Neurological:     Mental Status: She is alert.     Motor: No weakness.     Deep Tendon Reflexes: Reflexes are normal and symmetric.  Psychiatric:        Mood and Affect: Mood is anxious.        Cognition and Memory: Cognition and memory normal.     Comments: Mildly anxious  Asks good questions           Assessment & Plan:   Problem List Items Addressed This Visit      Other   Iron deficiency anemia - Primary    Suspect from menses  Continues her OC  Thinks this is helping but hard to tell early on  Taking niferex 150 mg bid  Hb last was 9.9 (iron of 43 and ferritin 4) Will re check this in  late December  Urged her to keep talking to her gyn re: menstrual issues  Suspect she may have fibroids      Relevant Medications   iron polysaccharides (NIFEREX) 150 MG capsule   Abdominal pain, diffuse    This is improved with omeprazole daily  Will plan a 3 month course and then decrease to every other day for 2 weeks and then stop if she tolerates this (will update if abd pain returns)

## 2020-07-30 NOTE — Assessment & Plan Note (Signed)
This is improved with omeprazole daily  Will plan a 3 month course and then decrease to every other day for 2 weeks and then stop if she tolerates this (will update if abd pain returns)

## 2020-07-30 NOTE — Assessment & Plan Note (Signed)
Suspect from menses  Continues her OC  Thinks this is helping but hard to tell early on  Taking niferex 150 mg bid  Hb last was 9.9 (iron of 43 and ferritin 4) Will re check this in late December  Urged her to keep talking to her gyn re: menstrual issues  Suspect she may have fibroids

## 2020-09-01 ENCOUNTER — Telehealth: Payer: Self-pay | Admitting: Family Medicine

## 2020-09-01 DIAGNOSIS — D500 Iron deficiency anemia secondary to blood loss (chronic): Secondary | ICD-10-CM

## 2020-09-01 NOTE — Telephone Encounter (Signed)
-----   Message from Cloyd Stagers, RT sent at 08/20/2020 10:30 AM EST ----- Regarding: Lab Orders for Monday 12.27.2021 Please place lab orders for Monday 12.27.2021, appt notes state "recheck labs" Thank you, Dyke Maes RT(R)

## 2020-09-02 ENCOUNTER — Other Ambulatory Visit: Payer: Self-pay

## 2020-09-02 ENCOUNTER — Encounter: Payer: Self-pay | Admitting: Family Medicine

## 2020-09-02 ENCOUNTER — Other Ambulatory Visit: Payer: 59

## 2020-09-02 ENCOUNTER — Ambulatory Visit (INDEPENDENT_AMBULATORY_CARE_PROVIDER_SITE_OTHER): Payer: 59 | Admitting: Family Medicine

## 2020-09-02 VITALS — BP 122/74 | HR 95 | Temp 97.4°F | Ht 66.5 in | Wt 142.5 lb

## 2020-09-02 DIAGNOSIS — R1084 Generalized abdominal pain: Secondary | ICD-10-CM | POA: Diagnosis not present

## 2020-09-02 DIAGNOSIS — D259 Leiomyoma of uterus, unspecified: Secondary | ICD-10-CM | POA: Insufficient documentation

## 2020-09-02 DIAGNOSIS — N920 Excessive and frequent menstruation with regular cycle: Secondary | ICD-10-CM

## 2020-09-02 DIAGNOSIS — R102 Pelvic and perineal pain: Secondary | ICD-10-CM

## 2020-09-02 DIAGNOSIS — D500 Iron deficiency anemia secondary to blood loss (chronic): Secondary | ICD-10-CM | POA: Diagnosis not present

## 2020-09-02 DIAGNOSIS — F430 Acute stress reaction: Secondary | ICD-10-CM | POA: Diagnosis not present

## 2020-09-02 LAB — CBC WITH DIFFERENTIAL/PLATELET
Basophils Absolute: 0.1 10*3/uL (ref 0.0–0.1)
Basophils Relative: 1.1 % (ref 0.0–3.0)
Eosinophils Absolute: 0.3 10*3/uL (ref 0.0–0.7)
Eosinophils Relative: 5.8 % — ABNORMAL HIGH (ref 0.0–5.0)
HCT: 32.4 % — ABNORMAL LOW (ref 36.0–46.0)
Hemoglobin: 10.2 g/dL — ABNORMAL LOW (ref 12.0–15.0)
Lymphocytes Relative: 30.8 % (ref 12.0–46.0)
Lymphs Abs: 1.7 10*3/uL (ref 0.7–4.0)
MCHC: 31.5 g/dL (ref 30.0–36.0)
MCV: 74.1 fl — ABNORMAL LOW (ref 78.0–100.0)
Monocytes Absolute: 0.5 10*3/uL (ref 0.1–1.0)
Monocytes Relative: 8.6 % (ref 3.0–12.0)
Neutro Abs: 2.9 10*3/uL (ref 1.4–7.7)
Neutrophils Relative %: 53.7 % (ref 43.0–77.0)
Platelets: 490 10*3/uL — ABNORMAL HIGH (ref 150.0–400.0)
RBC: 4.37 Mil/uL (ref 3.87–5.11)
RDW: 19.5 % — ABNORMAL HIGH (ref 11.5–15.5)
WBC: 5.4 10*3/uL (ref 4.0–10.5)

## 2020-09-02 LAB — IRON: Iron: 17 ug/dL — ABNORMAL LOW (ref 42–145)

## 2020-09-02 LAB — FERRITIN: Ferritin: 3.7 ng/mL — ABNORMAL LOW (ref 10.0–291.0)

## 2020-09-02 NOTE — Assessment & Plan Note (Signed)
Suspect from menses ferrex bid- hates it ? GI issues Lab today  OC has helped some ? If needs to investigate way to stop menses

## 2020-09-02 NOTE — Assessment & Plan Note (Signed)
Pt is very anxious over her iron dosing/possible GI issues  Discussed this  May need to investigate ways to stop menses (sees gyn) so she does not need it

## 2020-09-02 NOTE — Assessment & Plan Note (Signed)
Upper abd symptoms are better since starting omeprazole

## 2020-09-02 NOTE — Progress Notes (Signed)
Subjective:    Patient ID: Christine Carr, female    DOB: [birth year], 24 y.o.   MRN: [id number]  This visit occurred during the SARS-CoV-2 public health emergency.  Safety protocols were in place, including screening questions prior to the visit, additional usage of staff PPE, and extensive cleaning of exam room while observing appropriate contact time as indicated for disinfecting solutions.    HPI Pt presents with GI c/o   (also has a lab appt today)   Wt Readings from Last 3 Encounters:  09/02/20 142 lb 8 oz (64.6 kg)  07/30/20 140 lb (63.5 kg)  07/04/20 140 lb (63.5 kg)   22.66 kg/m  Has some bloating over R lower abdomen Not really pain, more discomfort  Noticed it around last period - that seemed to make it go away  Then it went away  Lasted about 2 weeks  No gas  Pretty regular, not as constipated   Omeprazole is helping her upper abd pain   Periods are a bit shorter- 5 days  Still somewhat heavy for the first few days  No clots   On nikki 3-0.02 mg OC Still taking niferex for her anemia  - suspects this may cause some of her problems    No susp of STD at all  Not sexually active   Last gyn visit was nov 5th for colposcopy  Planned 6 mo f/u   She gets quite anxious over her symptoms     Last abd xray DG Abd 2 Views (Accession [id number]) (Order [id number]) Imaging Date: 07/04/2020 Department: [place] [facility] Ordering/Authorizing: Niasha Devins, Wynelle Fanny, MD    Exam Status  Status  Final [99]   PACS Intelerad Image Link  Show images for DG Abd 2 Views  Study Result  Narrative & Impression  CLINICAL DATA:  Right-sided pain  EXAM: ABDOMEN - 2 VIEW  COMPARISON:  None.  FINDINGS: The bowel gas pattern is normal. There is no evidence of free air. No radio-opaque calculi or other significant radiographic abnormality is seen. Probable phleboliths in the left pelvis. Moderate stool in the colon. Amorphous calcification overlying  left upper quadrant possibly costochondral.  IMPRESSION: Nonobstructed gas pattern with moderate stool   Electronically Signed   By: Donavan Foil M.D.   On: 07/05/2020 23:46    Lab Results  Component Value Date   WBC 7.1 07/12/2020   HGB 9.9 (L) 07/12/2020   HCT 32.8 (L) 07/12/2020   MCV 75.1 (L) 07/12/2020   PLT 511 (H) 07/12/2020    Due for re check of cbc, iron, ferritin for her anemia Last time we inc iron to bid  Periods are about 50% lighter with OC per pt  She greatly dislikes iron and wants to come off of it  No known h/o thal or [place] trait   Lab Results  Component Value Date   CREATININE 0.83 06/26/2020   BUN 17 06/26/2020   NA 135 06/26/2020   K 4.3 06/26/2020   CL 102 06/26/2020   CO2 27 06/26/2020    Patient Active Problem List   Diagnosis Date Noted  . Pelvic pain 09/02/2020  . Stress reaction 09/02/2020  . Abdominal pain, diffuse 06/26/2020  . Iron deficiency anemia 04/28/2020  . Dry mouth 03/13/2019  . Encounter for screening mammogram for breast cancer 08/01/2018  . Encounter for annual routine gynecological examination 08/01/2018  . Skin lesion 06/16/2018  . Routine general medical examination at a health care facility 05/28/2017  . Fibrocystic  breast changes of both breasts 11/21/2013  . Encounter for other general counseling or advice on contraception 06/12/2013   Past Medical History:  Diagnosis Date  . Lump or mass in breast 2013   right    History reviewed. No pertinent surgical history. Social History   Tobacco Use  . Smoking status: Never Smoker  . Smokeless tobacco: Never Used  Substance Use Topics  . Alcohol use: Yes    Alcohol/week: 0.0 standard drinks    Comment: socially  . Drug use: No   Family History  Problem Relation Age of Onset  . Lung cancer Maternal Aunt   . CAD Maternal Uncle    No Known Allergies Current Outpatient Medications on File Prior to Visit  Medication Sig Dispense Refill  . acetic acid 5 %  SOLN Apply 1 application topically.    . iron polysaccharides (NIFEREX) 150 MG capsule Take 1 capsule (150 mg total) by mouth 2 (two) times daily. 60 capsule 5  . NIKKI 3-0.02 MG tablet Take 1 tablet by mouth daily.    Marland Kitchen omeprazole (PRILOSEC) 20 MG capsule Take 1 capsule (20 mg total) by mouth daily. 30 capsule 4  . tretinoin (RETIN-A) 0.1 % cream Apply 1 application topically every other day.     No current facility-administered medications on file prior to visit.    Review of Systems  Constitutional: Positive for fatigue. Negative for activity change, appetite change, fever and unexpected weight change.       Fatigue is some improved  HENT: Negative for congestion, ear pain, rhinorrhea, sinus pressure and sore throat.   Eyes: Negative for pain, redness and visual disturbance.  Respiratory: Negative for cough, shortness of breath and wheezing.   Cardiovascular: Negative for chest pain and palpitations.  Gastrointestinal: Positive for constipation. Negative for abdominal pain, blood in stool, diarrhea, nausea, rectal pain and vomiting.       Feels bloated Constipation is improved Stool is green from iron supplement   Endocrine: Negative for polydipsia and polyuria.  Genitourinary: Positive for menstrual problem and pelvic pain. Negative for dysuria, frequency, urgency and vaginal discharge.  Musculoskeletal: Negative for arthralgias, back pain and myalgias.  Skin: Negative for pallor and rash.  Allergic/Immunologic: Negative for environmental allergies.  Neurological: Negative for dizziness, syncope and headaches.  Hematological: Negative for adenopathy. Does not bruise/bleed easily.  Psychiatric/Behavioral: Negative for decreased concentration and dysphoric mood. The patient is nervous/anxious.        Anxious about her symptoms        Objective:   Physical Exam Constitutional:      General: She is not in acute distress.    Appearance: Normal appearance. She is well-developed,  normal weight and well-nourished. She is not ill-appearing or diaphoretic.  HENT:     Head: Normocephalic and atraumatic.     Mouth/Throat:     Mouth: Oropharynx is clear and moist.  Eyes:     General: No scleral icterus.    Extraocular Movements: EOM normal.     Conjunctiva/sclera: Conjunctivae normal.     Pupils: Pupils are equal, round, and reactive to light.  Cardiovascular:     Rate and Rhythm: Normal rate and regular rhythm.     Heart sounds: Normal heart sounds.  Pulmonary:     Effort: Pulmonary effort is normal. No respiratory distress.     Breath sounds: Normal breath sounds. No wheezing or rales.  Abdominal:     General: Bowel sounds are normal. There is no distension.  Palpations: Abdomen is soft. There is no mass.     Tenderness: There is no abdominal tenderness. There is no guarding or rebound.     Hernia: No hernia is present.     Comments: No tenderness in area of prev pain (R pelvic)  Noted uterus seems to be enlarged by palpation -not tender    Musculoskeletal:     Cervical back: Normal range of motion and neck supple.  Lymphadenopathy:     Cervical: No cervical adenopathy.  Skin:    General: Skin is warm and dry.     Coloration: Skin is not pale.     Findings: No erythema.  Neurological:     Mental Status: She is alert.  Psychiatric:        Mood and Affect: Mood is anxious.        Cognition and Memory: Cognition and memory normal.     Comments: Anxious about recent symptoms  Anxious about having to take iron            Assessment & Plan:   Problem List Items Addressed This Visit      Other   Iron deficiency anemia    Suspect from menses ferrex bid- hates it ? GI issues Lab today  OC has helped some ? If needs to investigate way to stop menses      Abdominal pain, diffuse    Upper abd symptoms are better since starting omeprazole      Pelvic pain - Primary    R sided for 2 weeks  Resolved with menses On exam-uterus seems to be  enlarged  Wonder about poss of fibroids or ov cyst Ordered pelvic US  Sees gyn also  On OC       Relevant Orders   US Pelvic Complete With Transvaginal   Stress reaction    Pt is very anxious over her iron dosing/possible GI issues  Discussed this  May need to investigate ways to stop menses (sees gyn) so she does not need it      Heavy menses    Per pt 50% better with OC  Re check her labs for anemia today  If no imp may need to address with her gyn provider

## 2020-09-02 NOTE — Assessment & Plan Note (Signed)
Per pt 50% better with OC  Re check her labs for anemia today  If no imp may need to address with her gyn provider

## 2020-09-02 NOTE — Assessment & Plan Note (Signed)
R sided for 2 weeks  Resolved with menses On exam-uterus seems to be enlarged  Wonder about poss of fibroids or ov cyst Ordered pelvic US  Sees gyn also  On OC

## 2020-09-02 NOTE — Patient Instructions (Addendum)
I ordered a pelvic ultrasound to investigate your pelvic pain   Labs today for anemia

## 2020-09-05 ENCOUNTER — Ambulatory Visit: Payer: 59

## 2020-09-07 ENCOUNTER — Telehealth: Payer: Self-pay | Admitting: Family Medicine

## 2020-09-07 DIAGNOSIS — D500 Iron deficiency anemia secondary to blood loss (chronic): Secondary | ICD-10-CM

## 2020-09-07 NOTE — Telephone Encounter (Signed)
-----   Message from Shon Millet, [place] sent at 09/05/2020  4:29 PM EST ----- Pt notified of lab results and Dr. Royden Purl comments. Pt agrees with referral to hematologist. Pt would like to see someone in [place] if possible. I advise PCP will put referral in and our [facility] will call to schedule appt

## 2020-09-11 ENCOUNTER — Encounter: Payer: Self-pay | Admitting: Family Medicine

## 2020-09-30 ENCOUNTER — Inpatient Hospital Stay: Payer: 59

## 2020-09-30 ENCOUNTER — Inpatient Hospital Stay: Payer: 59 | Admitting: Internal Medicine

## 2020-09-30 ENCOUNTER — Ambulatory Visit: Payer: 59

## 2020-11-18 ENCOUNTER — Other Ambulatory Visit: Payer: 59

## 2020-11-18 ENCOUNTER — Encounter: Payer: 59 | Admitting: Internal Medicine

## 2020-11-28 ENCOUNTER — Encounter: Payer: Self-pay | Admitting: *Deleted

## 2020-12-02 ENCOUNTER — Ambulatory Visit
Admission: RE | Admit: 2020-12-02 | Discharge: 2020-12-02 | Disposition: A | Discharge status: Home / Self Care | Payer: 59 | Source: Ambulatory Visit | Attending: Family Medicine | Admitting: Family Medicine

## 2020-12-02 ENCOUNTER — Other Ambulatory Visit: Payer: Self-pay | Admitting: Family Medicine

## 2020-12-02 ENCOUNTER — Encounter: Payer: Self-pay | Admitting: Internal Medicine

## 2020-12-02 ENCOUNTER — Other Ambulatory Visit: Payer: Self-pay

## 2020-12-02 ENCOUNTER — Inpatient Hospital Stay: Payer: 59

## 2020-12-02 ENCOUNTER — Inpatient Hospital Stay: Payer: 59 | Attending: Internal Medicine | Admitting: Internal Medicine

## 2020-12-02 DIAGNOSIS — E611 Iron deficiency: Secondary | ICD-10-CM

## 2020-12-02 DIAGNOSIS — N921 Excessive and frequent menstruation with irregular cycle: Secondary | ICD-10-CM | POA: Diagnosis not present

## 2020-12-02 DIAGNOSIS — D500 Iron deficiency anemia secondary to blood loss (chronic): Secondary | ICD-10-CM | POA: Diagnosis not present

## 2020-12-02 DIAGNOSIS — R102 Pelvic and perineal pain: Secondary | ICD-10-CM | POA: Diagnosis present

## 2020-12-02 LAB — COMPREHENSIVE METABOLIC PANEL
ALT: 13 U/L (ref 0–44)
AST: 18 U/L (ref 15–41)
Albumin: 4.2 g/dL (ref 3.5–5.0)
Alkaline Phosphatase: 49 U/L (ref 38–126)
Anion gap: 12 (ref 5–15)
BUN: 13 mg/dL (ref 6–20)
CO2: 22 mmol/L (ref 22–32)
Calcium: 9 mg/dL (ref 8.9–10.3)
Chloride: 101 mmol/L (ref 98–111)
Creatinine, Ser: 0.69 mg/dL (ref 0.44–1.00)
GFR, Estimated: >60 mL/min (ref >60)
Glucose, Bld: 126 mg/dL — ABNORMAL HIGH (ref 70–99)
Potassium: 4.1 mmol/L (ref 3.5–5.1)
Sodium: 135 mmol/L (ref 135–145)
Total Bilirubin: 0.5 mg/dL (ref 0.3–1.2)
Total Protein: 8.5 g/dL — ABNORMAL HIGH (ref 6.5–8.1)

## 2020-12-02 LAB — CBC WITH DIFFERENTIAL/PLATELET
Abs Immature Granulocytes: 0.01 10*3/uL (ref 0.00–0.07)
Basophils Absolute: 0.1 10*3/uL (ref 0.0–0.1)
Basophils Relative: 1 %
Eosinophils Absolute: 0.3 10*3/uL (ref 0.0–0.5)
Eosinophils Relative: 4 %
HCT: 36.8 % (ref 36.0–46.0)
Hemoglobin: 11.4 g/dL — ABNORMAL LOW (ref 12.0–15.0)
Immature Granulocytes: 0 %
Lymphocytes Relative: 33 %
Lymphs Abs: 2.2 10*3/uL (ref 0.7–4.0)
MCH: 24.6 pg — ABNORMAL LOW (ref 26.0–34.0)
MCHC: 31 g/dL (ref 30.0–36.0)
MCV: 79.3 fL — ABNORMAL LOW (ref 80.0–100.0)
Monocytes Absolute: 0.4 10*3/uL (ref 0.1–1.0)
Monocytes Relative: 5 %
Neutro Abs: 3.7 10*3/uL (ref 1.7–7.7)
Neutrophils Relative %: 57 %
Platelets: 519 10*3/uL — ABNORMAL HIGH (ref 150–400)
RBC: 4.64 MIL/uL (ref 3.87–5.11)
RDW: 18.6 % — ABNORMAL HIGH (ref 11.5–15.5)
WBC: 6.6 10*3/uL (ref 4.0–10.5)
nRBC: 0 % (ref 0.0–0.2)

## 2020-12-02 LAB — IRON AND TIBC
Iron: 32 ug/dL (ref 28–170)
Saturation Ratios: 7 % — ABNORMAL LOW (ref 10.4–31.8)
TIBC: 468 ug/dL — ABNORMAL HIGH (ref 250–450)
UIBC: 436 ug/dL

## 2020-12-02 LAB — FERRITIN: Ferritin: 5 ng/mL — ABNORMAL LOW (ref 11–307)

## 2020-12-02 LAB — LACTATE DEHYDROGENASE: LDH: 108 U/L (ref 98–192)

## 2020-12-02 NOTE — Assessment & Plan Note (Addendum)
#  Iron deficiency anemia secondary to chronic blood loss [heavy menstrual cycles]; hemoglobin around 10; ferritin~5 [dec 2021].  Patient tolerating p.o. iron fairly well  Moderately symptomatic.   # Discussed regarding possible IV iron infusions.  Discussed the potential acute infusion reactions with IV iron; which are quite rare.  Await labs from today; decide if she needs any IV iron infusions.  # heavy menstrual cycles- on BCPS; recommend compliance  Thank you Dr.Tower for allowing me to participate in the care of your pleasant patient. Please do not hesitate to contact me with questions or concerns in the interim.  # DISPOSITION:will call with results.  # labs- today # follow up TBD-Dr.B

## 2020-12-02 NOTE — Progress Notes (Signed)
[place] CONSULT NOTE  Patient Care Team: Tower, Wynelle Fanny, MD as PCP - General (Family Medicine) Bary Castilla, Forest Gleason, MD as Consulting Physician (General Surgery)  CHIEF COMPLAINTS/PURPOSE OF CONSULTATION: ANEMIA   HEMATOLOGY HISTORY:  # CHRONIC ANEMIA [july2021-] Hb 10; Feritin ~5.[dec 2021]   #Heavy menstrual cycles  HISTORY OF PRESENTING ILLNESS:  Christine Carr 25 y.o.  female has been referred to [place] for further evaluation/work-up for anemia.  Patient noted to have anemia in the summer 2021.  She has heavy menstrual cycles; however inconsistent with BCPs.   Patient has been tired.;  At times noticed to be sleepy at work.   Blood in stools: None Change in bowel habits- None Blood in urine: None Difficulty swallowing: None Abnormal weight loss: None Iron supplementation:  Prior Blood transfusions: on PO iron- summer 2021- BID;  Bariatric surgery: None EGD/Colonoscopy:none  Vaginal bleeding: heavy menstrual cycles; inconsistent with BCPs.   Review of Systems  Constitutional: Positive for malaise/fatigue. Negative for chills, diaphoresis, fever and weight loss.  HENT: Negative for nosebleeds and sore throat.   Eyes: Negative for double vision.  Respiratory: Negative for cough, hemoptysis, sputum production, shortness of breath and wheezing.   Cardiovascular: Negative for chest pain, palpitations, orthopnea and leg swelling.  Gastrointestinal: Negative for abdominal pain, blood in stool, constipation, diarrhea, heartburn, melena, nausea and vomiting.  Genitourinary: Negative for dysuria, frequency and urgency.  Musculoskeletal: Negative for back pain and joint pain.  Skin: Negative.  Negative for itching and rash.  Neurological: Negative for dizziness, tingling, focal weakness, weakness and headaches.  Endo/Heme/Allergies: Does not bruise/bleed easily.  Psychiatric/Behavioral: Negative for depression. The patient is not nervous/anxious and does not have  insomnia.     MEDICAL HISTORY:  Past Medical History:  Diagnosis Date  . Iron deficiency anemia due to chronic blood loss   . Lump or mass in breast 2013   right   . Pap smear of cervix with ASCUS, cannot exclude HGSIL     SURGICAL HISTORY: No past surgical history on file.  SOCIAL HISTORY: Social History   Socioeconomic History  . Marital status: Single    Spouse name: Not on file  . Number of children: Not on file  . Years of education: Not on file  . Highest education level: Not on file  Occupational History  . Not on file  Tobacco Use  . Smoking status: Never Smoker  . Smokeless tobacco: Never Used  Substance and Sexual Activity  . Alcohol use: Yes    Alcohol/week: 0.0 standard drinks    Comment: socially  . Drug use: No  . Sexual activity: Never  Other Topics Concern  . 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 No smoking. Social alcohol. No children; lives in [place].    Social Determinants of Health   Financial Resource Strain: Not on file  Food Insecurity: Not on file  Transportation Needs: Not on file  Physical Activity: Not on file  Stress: Not on file  Social Connections: Not on file  Intimate Partner Violence: Not on file    FAMILY HISTORY: Family History  Problem Relation Age of Onset  . Lung cancer Maternal Aunt   . CAD Maternal Uncle     ALLERGIES:  has No Known Allergies.  MEDICATIONS:  Current Outpatient Medications  Medication Sig Dispense Refill  . acetic acid 5 % SOLN Apply 1 application topically.    . iron polysaccharides (NIFEREX) 150 MG  capsule Take 1 capsule (150 mg total) by mouth 2 (two) times daily. 60 capsule 5  . NIKKI 3-0.02 MG tablet Take 1 tablet by mouth daily.    Marland Kitchen omeprazole (PRILOSEC) 20 MG capsule Take 1 capsule (20 mg total) by mouth daily. 30 capsule 4  . tretinoin (RETIN-A) 0.1 % cream Apply 1 application topically every other day.     No current facility-administered  medications for this visit.      PHYSICAL EXAMINATION:   Vitals:   12/02/20 1400  BP: 131/80  Pulse: (!) 59  Temp: (!) 97 F (36.1 C)   Filed Weights   12/02/20 1400  Weight: 145 lb 6.4 oz (66 kg)    Physical Exam HENT:     Head: Normocephalic and atraumatic.     Mouth/Throat:     Pharynx: No oropharyngeal exudate.  Eyes:     Pupils: Pupils are equal, round, and reactive to light.  Cardiovascular:     Rate and Rhythm: Normal rate and regular rhythm.  Pulmonary:     Effort: No respiratory distress.     Breath sounds: No wheezing.  Abdominal:     General: Bowel sounds are normal. There is no distension.     Palpations: Abdomen is soft. There is no mass.     Tenderness: There is no abdominal tenderness. There is no guarding or rebound.  Musculoskeletal:        General: No tenderness. Normal range of motion.     Cervical back: Normal range of motion and neck supple.  Skin:    General: Skin is warm.  Neurological:     Mental Status: She is alert and oriented to person, place, and time.  Psychiatric:        Mood and Affect: Affect normal.     LABORATORY DATA:  I have reviewed the data as listed Lab Results  Component Value Date   WBC 5.4 09/02/2020   HGB 10.2 (L) 09/02/2020   HCT 32.4 (L) 09/02/2020   MCV 74.1 (L) 09/02/2020   PLT 490.0 (H) 09/02/2020   Recent Labs    04/22/20 0901 06/26/20 0840  NA 136 135  K 4.4 4.3  CL 104 102  CO2 23 27  GLUCOSE 86 80  BUN 18 17  CREATININE 0.78 0.83  CALCIUM 9.3 9.5  PROT 7.7 7.5  ALBUMIN 4.1 4.0  AST 14 14  ALT 8 8  ALKPHOS 47 44  BILITOT 0.5 0.4  BILIDIR  --  0.1     No results found.  Iron deficiency #Iron deficiency anemia secondary to chronic blood loss [heavy menstrual cycles]; hemoglobin around 10; ferritin~5 [dec 2021].  Patient tolerating p.o. iron fairly well  Moderately symptomatic.   # Discussed regarding possible IV iron infusions.  Discussed the potential acute infusion reactions with  IV iron; which are quite rare.  Await labs from today; decide if she needs any IV iron infusions.  # heavy menstrual cycles- on BCPS; recommend compliance  Thank you Dr.Tower for allowing me to participate in the care of your pleasant patient. Please do not hesitate to contact me with questions or concerns in the interim.  # DISPOSITION:will call with results.  # labs- today # follow up TBD-Dr.B  All questions were answered. The patient knows to call the clinic with any problems, questions or concerns.    Cammie Sickle, MD 12/02/2020 2:42 PM

## 2020-12-03 ENCOUNTER — Other Ambulatory Visit: Payer: Self-pay | Admitting: Internal Medicine

## 2020-12-03 ENCOUNTER — Telehealth: Payer: Self-pay | Admitting: Internal Medicine

## 2020-12-03 DIAGNOSIS — E611 Iron deficiency: Secondary | ICD-10-CM

## 2020-12-03 NOTE — Progress Notes (Signed)
venofer orders are in. GB

## 2020-12-03 NOTE — Telephone Encounter (Signed)
On 3/29-spoke to patient regarding results of the blood work; iron deficiency noted/mild anemia.  Patient currently tolerating p.o. iron fairly well.  She wants to continue the same.  C-please schedule 19-month follow-up-MD labs CBC BMP; possible Venofer.   Thanks GB

## 2020-12-04 NOTE — Telephone Encounter (Signed)
Pt has my chart and appt made 6/27

## 2020-12-04 NOTE — Addendum Note (Signed)
Addended by: Delice Bison E on: 12/04/2020 08:19 AM   Modules accepted: Orders

## 2021-03-03 ENCOUNTER — Inpatient Hospital Stay: Payer: 59 | Admitting: Internal Medicine

## 2021-03-03 ENCOUNTER — Inpatient Hospital Stay: Payer: 59

## 2021-03-24 ENCOUNTER — Encounter: Payer: Self-pay | Admitting: Internal Medicine

## 2021-03-24 ENCOUNTER — Inpatient Hospital Stay: Payer: 59

## 2021-03-24 ENCOUNTER — Inpatient Hospital Stay: Payer: 59 | Attending: Internal Medicine | Admitting: Internal Medicine

## 2021-03-24 ENCOUNTER — Other Ambulatory Visit: Payer: Self-pay

## 2021-03-24 ENCOUNTER — Encounter: Payer: Self-pay | Admitting: *Deleted

## 2021-03-24 DIAGNOSIS — N920 Excessive and frequent menstruation with regular cycle: Secondary | ICD-10-CM | POA: Insufficient documentation

## 2021-03-24 DIAGNOSIS — D500 Iron deficiency anemia secondary to blood loss (chronic): Secondary | ICD-10-CM | POA: Diagnosis not present

## 2021-03-24 DIAGNOSIS — E611 Iron deficiency: Secondary | ICD-10-CM

## 2021-03-24 LAB — CBC WITH DIFFERENTIAL/PLATELET
Abs Immature Granulocytes: 0.02 10*3/uL (ref 0.00–0.07)
Basophils Absolute: 0.1 10*3/uL (ref 0.0–0.1)
Basophils Relative: 1 %
Eosinophils Absolute: 0.2 10*3/uL (ref 0.0–0.5)
Eosinophils Relative: 2 %
HCT: 39 % (ref 36.0–46.0)
Hemoglobin: 12.1 g/dL (ref 12.0–15.0)
Immature Granulocytes: 0 %
Lymphocytes Relative: 27 %
Lymphs Abs: 1.9 10*3/uL (ref 0.7–4.0)
MCH: 25.8 pg — ABNORMAL LOW (ref 26.0–34.0)
MCHC: 31 g/dL (ref 30.0–36.0)
MCV: 83.2 fL (ref 80.0–100.0)
Monocytes Absolute: 0.4 10*3/uL (ref 0.1–1.0)
Monocytes Relative: 6 %
Neutro Abs: 4.6 10*3/uL (ref 1.7–7.7)
Neutrophils Relative %: 64 %
Platelets: 479 10*3/uL — ABNORMAL HIGH (ref 150–400)
RBC: 4.69 MIL/uL (ref 3.87–5.11)
RDW: 17.6 % — ABNORMAL HIGH (ref 11.5–15.5)
WBC: 7.2 10*3/uL (ref 4.0–10.5)
nRBC: 0 % (ref 0.0–0.2)

## 2021-03-24 LAB — BASIC METABOLIC PANEL
Anion gap: 9 (ref 5–15)
BUN: 14 mg/dL (ref 6–20)
CO2: 20 mmol/L — ABNORMAL LOW (ref 22–32)
Calcium: 9.2 mg/dL (ref 8.9–10.3)
Chloride: 104 mmol/L (ref 98–111)
Creatinine, Ser: 0.73 mg/dL (ref 0.44–1.00)
GFR, Estimated: >60 mL/min (ref >60)
Glucose, Bld: 122 mg/dL — ABNORMAL HIGH (ref 70–99)
Potassium: 3.5 mmol/L (ref 3.5–5.1)
Sodium: 133 mmol/L — ABNORMAL LOW (ref 135–145)

## 2021-03-24 MED ORDER — POLYSACCHARIDE IRON COMPLEX 150 MG PO CAPS: 150.0000 mg | ORAL_CAPSULE | Freq: Two times a day (BID) | ORAL | 5 refills | Status: DC

## 2021-03-24 NOTE — Progress Notes (Signed)
[facility] NOTE  Patient Care Team: Tower, Wynelle Fanny, MD as PCP - General (Family Medicine) Bary Castilla, Forest Gleason, MD as Consulting Physician (General Surgery)  CHIEF COMPLAINTS/PURPOSE OF CONSULTATION: ANEMIA   HEMATOLOGY HISTORY:  # CHRONIC ANEMIA [july2021-] Hb 10; Feritin ~5.[dec 9833] - on PO Iron/NO IV venofer.   #Heavy menstrual cycles:  inconsistent with BCPs.   HISTORY OF PRESENTING ILLNESS:  Christine Carr 25 y.o.  female iron deficient anemia is here for follow-up.  Patient denies any unusual shortness of breath or cough.  Mild fatigue.  Continues to have intermittent menstrual heavy periods  Review of Systems  Constitutional:  Positive for malaise/fatigue. Negative for chills, diaphoresis, fever and weight loss.  HENT:  Negative for nosebleeds and sore throat.   Eyes:  Negative for double vision.  Respiratory:  Negative for cough, hemoptysis, sputum production, shortness of breath and wheezing.   Cardiovascular:  Negative for chest pain, palpitations, orthopnea and leg swelling.  Gastrointestinal:  Negative for abdominal pain, blood in stool, constipation, diarrhea, heartburn, melena, nausea and vomiting.  Genitourinary:  Negative for dysuria, frequency and urgency.  Musculoskeletal:  Negative for back pain and joint pain.  Skin: Negative.  Negative for itching and rash.  Neurological:  Negative for dizziness, tingling, focal weakness, weakness and headaches.  Endo/Heme/Allergies:  Does not bruise/bleed easily.  Psychiatric/Behavioral:  Negative for depression. The patient is not nervous/anxious and does not have insomnia.    MEDICAL HISTORY:  Past Medical History:  Diagnosis Date   Iron deficiency anemia due to chronic blood loss    Lump or mass in breast 2013   right    Pap smear of cervix with ASCUS, cannot exclude HGSIL     SURGICAL HISTORY: No past surgical history on file.  SOCIAL HISTORY: Social History   Socioeconomic History    Marital status: Single    Spouse name: Not on file   Number of children: Not on file   Years of education: Not on file   Highest education level: Not on file  Occupational History   Not on file  Tobacco Use   Smoking status: Never   Smokeless tobacco: Never  Substance and Sexual Activity   Alcohol use: Yes    Alcohol/week: 0.0 standard drinks    Comment: socially   Drug use: No   Sexual activity: Never  Other Topics Concern   Not on file  Social History Narrative   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 No children; lives in [place].    Social Determinants of Health   Financial Resource Strain: Not on file  Food Insecurity: Not on file  Transportation Needs: Not on file  Physical Activity: Not on file  Stress: Not on file  Social Connections: Not on file  Intimate Partner Violence: Not on file    FAMILY HISTORY: Family History  Problem Relation Age of Onset   Lung cancer Maternal Aunt    CAD Maternal Uncle     ALLERGIES:  has No Known Allergies.  MEDICATIONS:  Current Outpatient Medications  Medication Sig Dispense Refill   acetic acid 5 % SOLN Apply 1 application topically.     NIKKI 3-0.02 MG tablet Take 1 tablet by mouth daily.     omeprazole (PRILOSEC) 20 MG capsule Take 1 capsule (20 mg total) by mouth daily. 30 capsule 4   tretinoin (RETIN-A) 0.1 % cream Apply 1 application topically every other day.     iron polysaccharides (NIFEREX)  150 MG capsule Take 1 capsule (150 mg total) by mouth 2 (two) times daily. 60 capsule 5   No current facility-administered medications for this visit.      PHYSICAL EXAMINATION:   Vitals:   03/24/21 1301  BP: (!) 143/91  Pulse: (!) 106  Resp: 16  Temp: 98.3 F (36.8 C)  SpO2: 100%   Filed Weights   03/24/21 1301  Weight: 147 lb 12.8 oz (67 kg)    Physical Exam HENT:     Head: Normocephalic and atraumatic.     Mouth/Throat:     Pharynx: No oropharyngeal exudate.  Eyes:      Pupils: Pupils are equal, round, and reactive to light.  Cardiovascular:     Rate and Rhythm: Normal rate and regular rhythm.  Pulmonary:     Effort: No respiratory distress.     Breath sounds: No wheezing.  Abdominal:     General: Bowel sounds are normal. There is no distension.     Palpations: Abdomen is soft. There is no mass.     Tenderness: no abdominal tenderness There is no guarding or rebound.  Musculoskeletal:        General: No tenderness. Normal range of motion.     Cervical back: Normal range of motion and neck supple.  Skin:    General: Skin is warm.  Neurological:     Mental Status: She is alert and oriented to person, place, and time.  Psychiatric:        Mood and Affect: Affect normal.    LABORATORY DATA:  I have reviewed the data as listed Lab Results  Component Value Date   WBC 7.2 03/24/2021   HGB 12.1 03/24/2021   HCT 39.0 03/24/2021   MCV 83.2 03/24/2021   PLT 479 (H) 03/24/2021   Recent Labs    04/22/20 0901 06/26/20 0840 12/02/20 1442 03/24/21 1223  NA 136 135 135 133*  K 4.4 4.3 4.1 3.5  CL 104 102 101 104  CO2 23 27 22  20*  GLUCOSE 86 80 126* 122*  BUN 18 17 13 14   CREATININE 0.78 0.83 0.69 0.73  CALCIUM 9.3 9.5 9.0 9.2  GFRNONAA  --   --  >60 >60  PROT 7.7 7.5 8.5*  --   ALBUMIN 4.1 4.0 4.2  --   AST 14 14 18   --   ALT 8 8 13   --   ALKPHOS 47 44 49  --   BILITOT 0.5 0.4 0.5  --   BILIDIR  --  0.1  --   --      No results found.  Iron deficiency #Iron deficiency anemia secondary to chronic blood loss [heavy menstrual cycles]; hemoglobin around 10; ferritin~5 [dec 2021].  Patient tolerating p.o. iron fairly well  Moderately symptomatic.   # Discussed regarding possible IV iron infusions.  Discussed the potential acute infusion reactions with IV iron; which are quite rare.  Await labs from today; decide if she needs any IV iron infusions.  # heavy menstrual cycles-sec to  on BCPS; recommend compliance   # DISPOSITION:note to  work # no infusion today # follow up in 6 months- MD; labs- cbc/cmp;iron studies;possible venofer-Dr.B  All questions were answered. The patient knows to call the clinic with any problems, questions or concerns.    Cammie Sickle, MD 03/30/2021 9:18 PM

## 2021-03-24 NOTE — Assessment & Plan Note (Addendum)
#  Iron deficiency anemia secondary to chronic blood loss [heavy menstrual cycles]; hemoglobin around 10; ferritin~5 [dec 2021].  Patient tolerating p.o. iron fairly well  Moderately symptomatic.   # Discussed regarding possible IV iron infusions.  Discussed the potential acute infusion reactions with IV iron; which are quite rare.  Await labs from today; decide if she needs any IV iron infusions.  # heavy menstrual cycles: continue  BCPs; recommend compliance   # DISPOSITION:note to work # no infusion today # follow up in 6 months- MD; labs- cbc/cmp;iron studies;possible venofer-Dr.B

## 2021-03-30 ENCOUNTER — Encounter: Payer: Self-pay | Admitting: Internal Medicine

## 2021-04-24 ENCOUNTER — Encounter: Payer: Self-pay | Admitting: Family Medicine

## 2021-04-25 ENCOUNTER — Encounter: Payer: Self-pay | Admitting: Nurse Practitioner

## 2021-04-25 ENCOUNTER — Telehealth (INDEPENDENT_AMBULATORY_CARE_PROVIDER_SITE_OTHER): Payer: 59 | Admitting: Nurse Practitioner

## 2021-04-25 ENCOUNTER — Other Ambulatory Visit: Payer: 59

## 2021-04-25 ENCOUNTER — Other Ambulatory Visit: Payer: Self-pay

## 2021-04-25 VITALS — Temp 100.9°F | Wt 148.0 lb

## 2021-04-25 DIAGNOSIS — J020 Streptococcal pharyngitis: Secondary | ICD-10-CM | POA: Insufficient documentation

## 2021-04-25 DIAGNOSIS — R5081 Fever presenting with conditions classified elsewhere: Secondary | ICD-10-CM

## 2021-04-25 DIAGNOSIS — J029 Acute pharyngitis, unspecified: Secondary | ICD-10-CM

## 2021-04-25 DIAGNOSIS — R112 Nausea with vomiting, unspecified: Secondary | ICD-10-CM | POA: Diagnosis not present

## 2021-04-25 DIAGNOSIS — Z20822 Contact with and (suspected) exposure to covid-19: Secondary | ICD-10-CM | POA: Insufficient documentation

## 2021-04-25 DIAGNOSIS — R1110 Vomiting, unspecified: Secondary | ICD-10-CM | POA: Insufficient documentation

## 2021-04-25 LAB — POCT RAPID STREP A (OFFICE): Rapid Strep A Screen: POSITIVE — AB

## 2021-04-25 LAB — POCT INFLUENZA A/B
Influenza A, POC: NEGATIVE
Influenza B, POC: NEGATIVE

## 2021-04-25 MED ORDER — ONDANSETRON HCL 4 MG PO TABS: 4.0000 mg | ORAL_TABLET | Freq: Three times a day (TID) | ORAL | 0 refills | Status: DC | PRN

## 2021-04-25 MED ORDER — AMOXICILLIN 500 MG PO CAPS: 500.0000 mg | ORAL_CAPSULE | Freq: Two times a day (BID) | ORAL | 0 refills | Status: AC

## 2021-04-25 NOTE — Telephone Encounter (Signed)
Spoke with patient. Patient already scheduled a virtual visit with Romilda Garret for today 04/25/21 at 2 pm

## 2021-04-25 NOTE — Progress Notes (Signed)
Patient ID: Christine Carr, female    DOB: February 28, 1996, 25 y.o.   MRN: [id number]  Virtual visit completed through [place], a video enabled telemedicine application. Due to national recommendations of social distancing due to COVID-19, a virtual visit is felt to be most appropriate for this patient at this time. Reviewed limitations, risks, security and privacy concerns of performing a virtual visit and the availability of in person appointments. I also reviewed that there may be a patient responsible charge related to this service. The patient agreed to proceed.   Patient location: home Provider location: [place] at [facility], office Persons participating in this virtual visit: patient, provider   If any vitals were documented, they were collected by patient at home unless specified below.    Temp (!) 100.9 F (38.3 C) Comment: per patient  Wt 148 lb (67.1 kg) Comment: per patient  LMP 04/04/2021   BMI 23.53 kg/m    CC: Close exposure to Covid 19 Subjective:   HPI: Christine Carr is a 26 y.o. female presenting on 04/25/2021 for an acute video visit. States that her mother tested positive for covid 19 on 04/22/2021. Got PCR tested on 04/23/2021 that was negative and then Rapid at home test today that was negative. She is having fever, chills, myalgias, N/V, Headache, sore throat, cough and congestion. SHe has tried Copywriter, advertising, dyquill, and tylenol. She has had somne relief. She feels that the alka seltzer was the cause of her vomiting. She has vomited twice.     Relevant past medical, surgical, family and social history reviewed and updated as indicated. Interim medical history since our last visit reviewed. Allergies and medications reviewed and updated. Outpatient Medications Prior to Visit  Medication Sig Dispense Refill   acetic acid 5 % SOLN Apply 1 application topically.     iron polysaccharides (NIFEREX) 150 MG capsule Take 1 capsule (150 mg total) by mouth 2 (two) times  daily. 60 capsule 5   NIKKI 3-0.02 MG tablet Take 1 tablet by mouth daily.     omeprazole (PRILOSEC) 20 MG capsule Take 1 capsule (20 mg total) by mouth daily. 30 capsule 4   tretinoin (RETIN-A) 0.1 % cream Apply 1 application topically every other day.     No facility-administered medications prior to visit.     Per HPI unless specifically indicated in ROS section below Review of Systems  Constitutional:  Positive for chills and fatigue.  HENT:  Positive for congestion and sore throat.   Respiratory:  Positive for cough. Negative for shortness of breath.   Cardiovascular:  Negative for chest pain.  Gastrointestinal:  Positive for nausea and vomiting. Negative for abdominal pain and diarrhea.  Musculoskeletal:  Positive for myalgias.  Allergic/Immunologic: Negative for immunocompromised state.  Neurological:  Positive for headaches.  Objective:  Temp (!) 100.9 F (38.3 C) Comment: per patient  Wt 148 lb (67.1 kg) Comment: per patient  LMP 04/04/2021   BMI 23.53 kg/m   Wt Readings from Last 3 Encounters:  04/25/21 148 lb (67.1 kg)  03/24/21 147 lb 12.8 oz (67 kg)  12/02/20 145 lb 6.4 oz (66 kg)       Physical exam: Gen: alert, NAD, not ill appearing Pulm: speaks in complete sentences without increased work of breathing Psych: normal mood, normal thought content      Results for orders placed or performed in visit on 123456  Basic metabolic panel  Result Value Ref Range   Sodium 133 (L) 135 - 145 mmol/L  Potassium 3.5 3.5 - 5.1 mmol/L   Chloride 104 98 - 111 mmol/L   CO2 20 (L) 22 - 32 mmol/L   Glucose, Bld 122 (H) 70 - 99 mg/dL   BUN 14 6 - 20 mg/dL   Creatinine, Ser 0.73 0.44 - 1.00 mg/dL   Calcium 9.2 8.9 - 10.3 mg/dL   GFR, Estimated >60 >60 mL/min   Anion gap 9 5 - 15  CBC with Differential/Platelet  Result Value Ref Range   WBC 7.2 4.0 - 10.5 K/uL   RBC 4.69 3.87 - 5.11 MIL/uL   Hemoglobin 12.1 12.0 - 15.0 g/dL   HCT 39.0 36.0 - 46.0 %   MCV 83.2 80.0 -  100.0 fL   MCH 25.8 (L) 26.0 - 34.0 pg   MCHC 31.0 30.0 - 36.0 g/dL   RDW 17.6 (H) 11.5 - 15.5 %   Platelets 479 (H) 150 - 400 K/uL   nRBC 0.0 0.0 - 0.2 %   Neutrophils Relative % 64 %   Neutro Abs 4.6 1.7 - 7.7 K/uL   Lymphocytes Relative 27 %   Lymphs Abs 1.9 0.7 - 4.0 K/uL   Monocytes Relative 6 %   Monocytes Absolute 0.4 0.1 - 1.0 K/uL   Eosinophils Relative 2 %   Eosinophils Absolute 0.2 0.0 - 0.5 K/uL   Basophils Relative 1 %   Basophils Absolute 0.1 0.0 - 0.1 K/uL   Immature Granulocytes 0 %   Abs Immature Granulocytes 0.02 0.00 - 0.07 K/uL   Assessment & Plan:   Problem List Items Addressed This Visit       Respiratory   Strep pharyngitis    Patient's rapid strep test came back positive.  Did call and notify patient of positive strep test discussed treatment with her we will send in amoxicillin 500 mg twice daily for 10 days.  Sent prescription to pharmacy of choice.  Start immediately.        Digestive   Non-intractable vomiting    Patient has been experiencing nausea and vomiting.  Needs.  States she had to episodes of vomiting she does think it is related to her Alka-Seltzer use.  I did ask if she would like some nausea medication and she stated she would.  We will send in Zofran 4 mg ODT 3 times daily as needed to pharmacy of choice.  Take medication as needed basis.      Relevant Medications   ondansetron (ZOFRAN) 4 MG tablet     Other   Close exposure to COVID-19 virus    Patient states her mother was diagnosed with COVID-19.  She has taken a PCR test before she had symptoms and that 1 at home test when she had symptoms both have been negative thus far. discussed with patient could have been too soon in the process to catch COVID so recommended retest.  Patient will come to office for COVID, flu, strep testing.      Relevant Orders   Novel Coronavirus, NAA ([facility])   Other Visit Diagnoses     Sore throat    -  Primary   Relevant Orders   Rapid Strep A  (Completed)   Fever in other diseases       Relevant Orders   Influenza A/B (Completed)        No orders of the defined types were placed in this encounter.  No orders of the defined types were placed in this encounter.   I discussed the assessment and treatment  plan with the patient. The patient was provided an opportunity to ask questions and all were answered. The patient agreed with the plan and demonstrated an understanding of the instructions. The patient was advised to call back or seek an in-person evaluation if the symptoms worsen or if the condition fails to improve as anticipated.  Follow up plan: No follow-ups on file.  Romilda Garret, NP

## 2021-04-25 NOTE — Assessment & Plan Note (Signed)
Patient's rapid strep test came back positive.  Did call and notify patient of positive strep test discussed treatment with her we will send in amoxicillin 500 mg twice daily for 10 days.  Sent prescription to pharmacy of choice.  Start immediately.

## 2021-04-25 NOTE — Assessment & Plan Note (Signed)
Patient states her mother was diagnosed with COVID-19.  She has taken a PCR test before she had symptoms and that 1 at home test when she had symptoms both have been negative thus far. discussed with patient could have been too soon in the process to catch COVID so recommended retest.  Patient will come to office for COVID, flu, strep testing.

## 2021-04-25 NOTE — Assessment & Plan Note (Signed)
Patient has been experiencing nausea and vomiting.  Needs.  States she had to episodes of vomiting she does think it is related to her Alka-Seltzer use.  I did ask if she would like some nausea medication and she stated she would.  We will send in Zofran 4 mg ODT 3 times daily as needed to pharmacy of choice.  Take medication as needed basis.

## 2021-04-26 LAB — NOVEL CORONAVIRUS, NAA: SARS-CoV-2, NAA: DETECTED — AB

## 2021-04-26 LAB — SARS-COV-2, NAA 2 DAY TAT

## 2021-04-28 ENCOUNTER — Other Ambulatory Visit: Payer: Self-pay | Admitting: Nurse Practitioner

## 2021-04-28 DIAGNOSIS — R059 Cough, unspecified: Secondary | ICD-10-CM

## 2021-04-28 MED ORDER — BENZONATATE 200 MG PO CAPS
200.0000 mg | ORAL_CAPSULE | Freq: Three times a day (TID) | ORAL | 0 refills | Status: AC | PRN
Start: 2021-04-28 — End: 2021-05-08

## 2021-05-06 ENCOUNTER — Encounter: Payer: Self-pay | Admitting: Family Medicine

## 2021-05-07 MED ORDER — PROMETHAZINE-DM 6.25-15 MG/5ML PO SYRP: 5.0000 mL | ORAL_SOLUTION | Freq: Three times a day (TID) | ORAL | 0 refills | Status: DC | PRN

## 2021-06-09 LAB — HM PAP SMEAR
HM Pap smear: NEGATIVE
HM Pap smear: NEGATIVE

## 2021-07-09 ENCOUNTER — Other Ambulatory Visit: Payer: Self-pay

## 2021-07-09 ENCOUNTER — Ambulatory Visit: Payer: 59 | Admitting: Nurse Practitioner

## 2021-07-09 ENCOUNTER — Encounter: Payer: Self-pay | Admitting: Nurse Practitioner

## 2021-07-09 VITALS — BP 148/92 | HR 106 | Temp 98.4°F | Ht 66.5 in | Wt 154.0 lb

## 2021-07-09 DIAGNOSIS — J029 Acute pharyngitis, unspecified: Secondary | ICD-10-CM | POA: Diagnosis not present

## 2021-07-09 DIAGNOSIS — R000 Tachycardia, unspecified: Secondary | ICD-10-CM

## 2021-07-09 LAB — POCT RAPID STREP A (OFFICE): Rapid Strep A Screen: NEGATIVE

## 2021-07-09 NOTE — Patient Instructions (Signed)
Nice to see you today I would retest for covid tomorrow or Friday to be certain Let me know if you symptoms don't start improving Check your pulse at home

## 2021-07-09 NOTE — Assessment & Plan Note (Signed)
Patient states has history of same says she is anxious and nervous her doctor's office.  Does have a smart watch which she is able to monitor her pulse.  Upon recheck she was still elevated and tachycardic but on EKG showed sinus tachycardia at a rate of 106.  Did discuss this with patient she can continue to monitor pulse at home having no other signs or symptoms no chest pain or shortness of breath.  Continue to monitor

## 2021-07-09 NOTE — Progress Notes (Signed)
Acute Office Visit  Subjective:    Patient ID: Christine Carr, female    DOB: [birth year], 25 y.o.   MRN: [id number]  Chief Complaint  Patient presents with   scratchy throat    HPI Patient is in today for Sore throat  Symptoms started yesterday  States felt dry throat and then her tohroat got scracthy Dyquill, cough drops, and alka seltzer, with some relief. Took Dyquill yesterday and today.  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 Past Medical History:  Diagnosis Date   Iron deficiency anemia due to chronic blood loss    Lump or mass in breast 2013   right    Pap smear of cervix with ASCUS, cannot exclude HGSIL     No past surgical history on file.  Family History  Problem Relation Age of Onset   Lung cancer Maternal Aunt    CAD Maternal Uncle     Social History   Socioeconomic History   Marital status: Single    Spouse name: Not on file   Number of children: Not on file   Years of education: Not on file   Highest education level: Not on file  Occupational History   Not on file  Tobacco Use   Smoking status: Never   Smokeless tobacco: Never  Substance and Sexual Activity   Alcohol use: Yes    Alcohol/week: 0.0 standard drinks    Comment: socially   Drug use: No   Sexual activity: Never  Other Topics Concern   Not on file  Social History Narrative   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 No children; lives in [place].    Social Determinants of Health   Financial Resource Strain: Not on file  Food Insecurity: Not on file  Transportation Needs: Not on file  Physical Activity: Not on file  Stress: Not on file  Social Connections: Not on file  Intimate Partner Violence: Not on file    Outpatient Medications Prior to Visit  Medication Sig Dispense Refill   acetic acid 5 % SOLN Apply 1 application topically.     iron polysaccharides  (NIFEREX) 150 MG capsule Take 1 capsule (150 mg total) by mouth 2 (two) times daily. 60 capsule 5   NIKKI 3-0.02 MG tablet Take 1 tablet by mouth daily.     omeprazole (PRILOSEC) 20 MG capsule Take 1 capsule (20 mg total) by mouth daily. 30 capsule 4   ondansetron (ZOFRAN) 4 MG tablet Take 1 tablet (4 mg total) by mouth every 8 (eight) hours as needed for nausea or vomiting. 20 tablet 0   promethazine-dextromethorphan (PROMETHAZINE-DM) 6.25-15 MG/5ML syrup Take 5 mLs by mouth 3 (three) times daily as needed for cough. Caution of sedation 118 mL 0   tranexamic acid (LYSTEDA) 650 MG TABS tablet Take by mouth.     tretinoin (RETIN-A) 0.1 % cream Apply 1 application topically every other day.     No facility-administered medications prior to visit.    No Known Allergies  Review of Systems  Constitutional:  Negative for chills, fatigue and fever.  HENT:  Positive for sore throat. Negative for congestion, ear discharge, ear pain, rhinorrhea and sinus pain.   Respiratory:  Negative for cough and shortness of breath.   Cardiovascular:  Negative for chest pain.  Gastrointestinal:  Negative for abdominal pain, diarrhea, nausea and vomiting.  Musculoskeletal:  Negative for arthralgias and myalgias.      Objective:    Physical Exam Vitals and nursing note reviewed.  Constitutional:      Appearance: Normal appearance.  HENT:     Right Ear: Tympanic membrane, ear canal and external ear normal. There is no impacted cerumen.     Left Ear: Tympanic membrane, ear canal and external ear normal. There is no impacted cerumen.     Nose:     Right Sinus: No maxillary sinus tenderness or frontal sinus tenderness.     Left Sinus: No maxillary sinus tenderness or frontal sinus tenderness.     Mouth/Throat:     Mouth: Mucous membranes are moist.     Pharynx: Posterior oropharyngeal erythema present. No oropharyngeal exudate.     Tonsils: No tonsillar exudate or tonsillar abscesses. 1+ on the right. 1+ on  the left.  Eyes:     Extraocular Movements: Extraocular movements intact.     Pupils: Pupils are equal, round, and reactive to light.     Comments: Wears glasses  Cardiovascular:     Rate and Rhythm: Regular rhythm. Tachycardia present.  Pulmonary:     Effort: Pulmonary effort is normal.     Breath sounds: Normal breath sounds.  Lymphadenopathy:     Cervical: Cervical adenopathy present.  Neurological:     Mental Status: She is alert.  Psychiatric:        Mood and Affect: Mood normal.        Behavior: Behavior normal.        Thought Content: Thought content normal.        Judgment: Judgment normal.    BP (!) 148/92   Pulse (!) 106   Temp 98.4 F (36.9 C) (Temporal)   Ht 5' 6.5" (1.689 m)   Wt 154 lb (69.9 kg)   SpO2 99%   BMI 24.48 kg/m  Wt Readings from Last 3 Encounters:  07/09/21 154 lb (69.9 kg)  04/25/21 148 lb (67.1 kg)  03/24/21 147 lb 12.8 oz (67 kg)    Health Maintenance Due  Topic Date Due   HIV Screening  Never done   Hepatitis C Screening  Never done   INFLUENZA VACCINE  04/07/2021   COVID-19 Vaccine (4 - Booster for [place] series) 06/30/2021   PAP-Cervical Cytology Screening  08/01/2021   PAP SMEAR-Modifier  08/01/2021    There are no preventive care reminders to display for this patient.   Lab Results  Component Value Date   TSH 1.00 04/22/2020   Lab Results  Component Value Date   WBC 7.2 03/24/2021   HGB 12.1 03/24/2021   HCT 39.0 03/24/2021   MCV 83.2 03/24/2021   PLT 479 (H) 03/24/2021   Lab Results  Component Value Date   NA 133 (L) 03/24/2021   K 3.5 03/24/2021   CO2 20 (L) 03/24/2021   GLUCOSE 122 (H) 03/24/2021   BUN 14 03/24/2021   CREATININE 0.73 03/24/2021   BILITOT 0.5 12/02/2020   ALKPHOS 49 12/02/2020   AST 18 12/02/2020   ALT 13 12/02/2020   PROT 8.5 (H) 12/02/2020   ALBUMIN 4.2 12/02/2020   CALCIUM 9.2 03/24/2021   ANIONGAP 9 03/24/2021   GFR 98.64 06/26/2020   Lab Results  Component Value Date   CHOL 148  04/22/2020   Lab Results  Component Value Date   HDL 68.40 04/22/2020   Lab Results  Component Value Date   LDLCALC 69 04/22/2020   Lab Results  Component Value  Date   TRIG 50.0 04/22/2020   Lab Results  Component Value Date   CHOLHDL 2 04/22/2020   No results found for: HGBA1C     Assessment & Plan:   Problem List Items Addressed This Visit       Other   Sore throat - Primary    Patient strep test was negative in office.  Did review over-the-counter treatments and modalities for symptom management.  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  She is able to do this at home with home testing.  Continue to monitor supportive treatment for now      Relevant Orders   Rapid Strep A (Completed)   Tachycardia    Patient states has history of same says she is anxious and nervous her doctor's office.  Does have a smart watch which she is able to monitor her pulse.  Upon recheck she was still elevated and tachycardic but on EKG showed sinus tachycardia at a rate of 106.  Did discuss this with patient she can continue to monitor pulse at home having no other signs or symptoms no chest pain or shortness of breath.  Continue to monitor       Relevant Orders   EKG 12-Lead (Completed)     No orders of the defined types were placed in this encounter.  This visit occurred during the SARS-CoV-2 public health emergency.  Safety protocols were in place, including screening questions prior to the visit, additional usage of staff PPE, and extensive cleaning of exam room while observing appropriate contact time as indicated for disinfecting solutions.      Romilda Garret, NP

## 2021-07-09 NOTE — Assessment & Plan Note (Signed)
Patient strep test was negative in office.  Did review over-the-counter treatments and modalities for symptom management.  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  She is able to do this at home with home testing.  Continue to monitor supportive treatment for now

## 2021-07-15 ENCOUNTER — Encounter: Payer: 59 | Admitting: Family Medicine

## 2021-07-23 ENCOUNTER — Other Ambulatory Visit: Payer: Self-pay

## 2021-07-23 ENCOUNTER — Encounter: Payer: Self-pay | Admitting: Family Medicine

## 2021-07-23 ENCOUNTER — Ambulatory Visit (INDEPENDENT_AMBULATORY_CARE_PROVIDER_SITE_OTHER): Payer: 59 | Admitting: Family Medicine

## 2021-07-23 VITALS — BP 134/78 | HR 93 | Temp 98.4°F | Ht 66.5 in | Wt 154.1 lb

## 2021-07-23 DIAGNOSIS — D259 Leiomyoma of uterus, unspecified: Secondary | ICD-10-CM

## 2021-07-23 DIAGNOSIS — Z113 Encounter for screening for infections with a predominantly sexual mode of transmission: Secondary | ICD-10-CM | POA: Diagnosis not present

## 2021-07-23 NOTE — Assessment & Plan Note (Signed)
Sees gyn at [facility] [place] pending Painful menses

## 2021-07-23 NOTE — Patient Instructions (Addendum)
Talk to your gyn about contraception options  I wonder if progesterone only or iud would be good options   Get your ultrasound as planned   Use condoms for contraception and STD screening   Be safe

## 2021-07-23 NOTE — Progress Notes (Signed)
Subjective:    Patient ID: Christine Carr, female    DOB: 1996-07-30, 25 y.o.   MRN: [id number]  This visit occurred during the SARS-CoV-2 public health emergency.  Safety protocols were in place, including screening questions prior to the visit, additional usage of staff PPE, and extensive cleaning of exam room while observing appropriate contact time as indicated for disinfecting solutions.   HPI Pt presents for STD screening   Wt Readings from Last 3 Encounters:  07/23/21 154 lb 2 oz (69.9 kg)  07/09/21 154 lb (69.9 kg)  04/25/21 148 lb (67.1 kg)    24.50 kg/m  Has a new partner  Wants routine screening  No symptoms   He is getting tested also   She sees gyn -planning [place]  Was on OC for a while, then caused it when she got fibroids Px lysteda but she has not tried it yet  Periods are   Had pap was in oct  06/09/21 in care everywhere  Neg with neg HPV screen    Neg STD and HSV screen in 2019    Patient Active Problem List   Diagnosis Date Noted   Screen for STD (sexually transmitted disease) 07/23/2021   Tachycardia 07/09/2021   Strep pharyngitis 04/25/2021   Non-intractable vomiting 04/25/2021   Close exposure to COVID-19 virus 04/25/2021   Iron deficiency 12/02/2020   Pelvic pain 09/02/2020   Stress reaction 09/02/2020   Heavy menses 09/02/2020   Abdominal pain, diffuse 06/26/2020   Iron deficiency anemia 04/28/2020   Dry mouth 03/13/2019   Encounter for screening mammogram for breast cancer 08/01/2018   Encounter for annual routine gynecological examination 08/01/2018   Skin lesion 06/16/2018   Sore throat 02/23/2018   Routine general medical examination at a health care facility 05/28/2017   Fibrocystic breast changes of both breasts 11/21/2013   Encounter for other general counseling or advice on contraception 06/12/2013   Past Medical History:  Diagnosis Date   Iron deficiency anemia due to chronic blood loss    Lump or mass in breast 2013   right     Pap smear of cervix with ASCUS, cannot exclude HGSIL    History reviewed. No pertinent surgical history. Social History   Tobacco Use   Smoking status: Never   Smokeless tobacco: Never  Substance Use Topics   Alcohol use: Yes    Alcohol/week: 0.0 standard drinks    Comment: socially   Drug use: No   Family History  Problem Relation Age of Onset   Lung cancer Maternal Aunt    CAD Maternal Uncle    No Known Allergies Current Outpatient Medications on File Prior to Visit  Medication Sig Dispense Refill   acetic acid 5 % SOLN Apply 1 application topically.     iron polysaccharides (NIFEREX) 150 MG capsule Take 1 capsule (150 mg total) by mouth 2 (two) times daily. 60 capsule 5   NIKKI 3-0.02 MG tablet Take 1 tablet by mouth daily.     omeprazole (PRILOSEC) 20 MG capsule Take 1 capsule (20 mg total) by mouth daily. 30 capsule 4   ondansetron (ZOFRAN) 4 MG tablet Take 1 tablet (4 mg total) by mouth every 8 (eight) hours as needed for nausea or vomiting. 20 tablet 0   tranexamic acid (LYSTEDA) 650 MG TABS tablet Take by mouth.     tretinoin (RETIN-A) 0.1 % cream Apply 1 application topically every other day.     No current facility-administered medications on file prior to  visit.    Review of Systems  Constitutional:  Negative for activity change, appetite change, fatigue, fever and unexpected weight change.  HENT:  Negative for congestion, ear pain, rhinorrhea, sinus pressure and sore throat.   Eyes:  Negative for pain, redness and visual disturbance.  Respiratory:  Negative for cough, shortness of breath and wheezing.   Cardiovascular:  Negative for chest pain and palpitations.  Gastrointestinal:  Negative for abdominal pain, blood in stool, constipation and diarrhea.  Endocrine: Negative for polydipsia and polyuria.  Genitourinary:  Negative for dysuria, frequency and urgency.  Musculoskeletal:  Negative for arthralgias, back pain and myalgias.  Skin:  Negative for pallor  and rash.  Allergic/Immunologic: Negative for environmental allergies.  Neurological:  Negative for dizziness, syncope and headaches.  Hematological:  Negative for adenopathy. Does not bruise/bleed easily.  Psychiatric/Behavioral:  Negative for decreased concentration and dysphoric mood. The patient is not nervous/anxious.       Objective:   Physical Exam Constitutional:      General: She is not in acute distress.    Appearance: Normal appearance. She is normal weight. She is not ill-appearing.  Eyes:     General:        Right eye: No discharge.        Left eye: No discharge.     Pupils: Pupils are equal, round, and reactive to light.  Cardiovascular:     Rate and Rhythm: Normal rate and regular rhythm.  Pulmonary:     Effort: Pulmonary effort is normal. No respiratory distress.     Breath sounds: Normal breath sounds.  Musculoskeletal:     Cervical back: Normal range of motion and neck supple.  Skin:    Coloration: Skin is not pale.     Findings: No erythema.  Neurological:     Mental Status: She is alert.  Psychiatric:        Mood and Affect: Mood normal.          Assessment & Plan:   Problem List Items Addressed This Visit       Genitourinary   Uterine fibroid    Sees gyn at [facility] [place] pending Painful menses        Other   Screen for STD (sexually transmitted disease) - Primary    Pt has a new partner Plans on condoms for contraception In w/u for fibroids/ stopped OC ? If progesterone only or iud would be an option  She will check with gyn No vaginal symptoms   Labs ordered  Disc std prevention       Relevant Orders   HIV Antibody (routine testing w rflx)   Hepatitis C antibody   C. TRACHOMATIS/N. GONORRHOEAE RNA ([facility])

## 2021-07-23 NOTE — Assessment & Plan Note (Signed)
Pt has a new partner Plans on condoms for contraception In w/u for fibroids/ stopped OC ? If progesterone only or iud would be an option  She will check with gyn No vaginal symptoms   Labs ordered  Disc std prevention

## 2021-07-24 LAB — C. TRACHOMATIS/N. GONORRHOEAE RNA
C. trachomatis RNA, TMA: NOT DETECTED
N. gonorrhoeae RNA, TMA: NOT DETECTED

## 2021-07-24 LAB — HEPATITIS C ANTIBODY
Hepatitis C Ab: NONREACTIVE
SIGNAL TO CUT-OFF: 0.04 (ref <1.00)

## 2021-07-24 LAB — HIV ANTIBODY (ROUTINE TESTING W REFLEX): HIV 1&2 Ab, 4th Generation: NONREACTIVE

## 2021-07-28 ENCOUNTER — Encounter: Payer: Self-pay | Admitting: Family Medicine

## 2021-08-14 ENCOUNTER — Other Ambulatory Visit: Payer: Self-pay

## 2021-08-14 ENCOUNTER — Ambulatory Visit (INDEPENDENT_AMBULATORY_CARE_PROVIDER_SITE_OTHER): Payer: 59 | Admitting: Family Medicine

## 2021-08-14 ENCOUNTER — Encounter: Payer: Self-pay | Admitting: Family Medicine

## 2021-08-14 VITALS — BP 130/70 | HR 91 | Temp 98.3°F | Ht 66.5 in | Wt 156.4 lb

## 2021-08-14 DIAGNOSIS — Z0000 Encounter for general adult medical examination without abnormal findings: Secondary | ICD-10-CM

## 2021-08-14 DIAGNOSIS — D500 Iron deficiency anemia secondary to blood loss (chronic): Secondary | ICD-10-CM | POA: Diagnosis not present

## 2021-08-14 LAB — IRON: Iron: 22 ug/dL — ABNORMAL LOW (ref 42–145)

## 2021-08-14 LAB — CBC WITH DIFFERENTIAL/PLATELET
Basophils Absolute: 0.1 10*3/uL (ref 0.0–0.1)
Basophils Relative: 2 % (ref 0.0–3.0)
Eosinophils Absolute: 0.2 10*3/uL (ref 0.0–0.7)
Eosinophils Relative: 5.7 % — ABNORMAL HIGH (ref 0.0–5.0)
HCT: 33.3 % — ABNORMAL LOW (ref 36.0–46.0)
Hemoglobin: 10.5 g/dL — ABNORMAL LOW (ref 12.0–15.0)
Lymphocytes Relative: 33.9 % (ref 12.0–46.0)
Lymphs Abs: 1.5 10*3/uL (ref 0.7–4.0)
MCHC: 31.7 g/dL (ref 30.0–36.0)
MCV: 80.5 fl (ref 78.0–100.0)
Monocytes Absolute: 0.4 10*3/uL (ref 0.1–1.0)
Monocytes Relative: 9.1 % (ref 3.0–12.0)
Neutro Abs: 2.1 10*3/uL (ref 1.4–7.7)
Neutrophils Relative %: 49.3 % (ref 43.0–77.0)
Platelets: 471 10*3/uL — ABNORMAL HIGH (ref 150.0–400.0)
RBC: 4.14 Mil/uL (ref 3.87–5.11)
RDW: 16.5 % — ABNORMAL HIGH (ref 11.5–15.5)
WBC: 4.3 10*3/uL (ref 4.0–10.5)

## 2021-08-14 LAB — COMPREHENSIVE METABOLIC PANEL
ALT: 12 U/L (ref 0–35)
AST: 16 U/L (ref 0–37)
Albumin: 3.9 g/dL (ref 3.5–5.2)
Alkaline Phosphatase: 46 U/L (ref 39–117)
BUN: 18 mg/dL (ref 6–23)
CO2: 22 mEq/L (ref 19–32)
Calcium: 8.8 mg/dL (ref 8.4–10.5)
Chloride: 107 mEq/L (ref 96–112)
Creatinine, Ser: 0.78 mg/dL (ref 0.40–1.20)
GFR: 105.71 mL/min (ref >60.00)
Glucose, Bld: 86 mg/dL (ref 70–99)
Potassium: 3.8 mEq/L (ref 3.5–5.1)
Sodium: 136 mEq/L (ref 135–145)
Total Bilirubin: 0.4 mg/dL (ref 0.2–1.2)
Total Protein: 7.6 g/dL (ref 6.0–8.3)

## 2021-08-14 LAB — LIPID PANEL
Cholesterol: 142 mg/dL (ref 0–200)
HDL: 63.3 mg/dL (ref >39.00)
LDL Cholesterol: 69 mg/dL (ref 0–99)
NonHDL: 78.21
Total CHOL/HDL Ratio: 2
Triglycerides: 44 mg/dL (ref 0.0–149.0)
VLDL: 8.8 mg/dL (ref 0.0–40.0)

## 2021-08-14 LAB — TSH: TSH: 0.67 u[IU]/mL (ref 0.35–5.50)

## 2021-08-14 NOTE — Assessment & Plan Note (Signed)
From heavy mense Taking niferex 150 mg bid and under care of hematology  Cbc and iron level ordered today

## 2021-08-14 NOTE — Progress Notes (Signed)
Subjective:    Patient ID: Christine Carr, female    DOB: 1996/06/21, 25 y.o.   MRN: [id number]  This visit occurred during the SARS-CoV-2 public health emergency.  Safety protocols were in place, including screening questions prior to the visit, additional usage of staff PPE, and extensive cleaning of exam room while observing appropriate contact time as indicated for disinfecting solutions.   HPI Here for health maintenance exam and to review chronic medical problems    Wt Readings from Last 3 Encounters:  08/14/21 156 lb 6 oz (70.9 kg)  07/23/21 154 lb 2 oz (69.9 kg)  07/09/21 154 lb (69.9 kg)   24.86 kg/m  Has been working  Doing ok overall   Is tired lately-nl for her  More anxious depending on what is going on - can come from anything  She goes to therapy and plans to try ashwagonda tabs    Flu shot- had this season  Tdap 05/2017 Covid immunized    Pap 06/2021 neg at gyn Was immunized for HPV Has h/o uterine fibroid , seen at gyn  Not taking the OC right now- unsure when she will re start  It may lighten period a little  She forgets to take it and then gets thrown off schedule  Does not want the IUD  She was px Lysteda - has to try with next period  Menses are still heavy   Using condoms for contraception   Self breast exam - does not do them as a rule but nothing new that she noticed and had gyn exam in the fall   Has had hep B and HIV screening last month-negative  Also gc/chl negative   H/o iron deficiency Has seen hematology  2nd to heavy menses   BP Readings from Last 3 Encounters:  08/14/21 130/70  07/23/21 134/78  07/09/21 (!) 148/92   Pulse Readings from Last 3 Encounters:  08/14/21 91  07/23/21 93  07/09/21 (!) 106     Lab Results  Component Value Date   WBC 7.2 03/24/2021   HGB 12.1 03/24/2021   HCT 39.0 03/24/2021   MCV 83.2 03/24/2021   PLT 479 (H) 03/24/2021   Lab Results  Component Value Date   IRON 32 12/02/2020   TIBC  468 (H) 12/02/2020   FERRITIN 5 (L) 12/02/2020  Taking oral iron and tolerates it ok  Did not end up needing iron infusion last time   Takes omeprazole for gi acid issues -only as needed when she eats the wrong thing  Spicy food  Overall doing well    Cholesterol Lab Results  Component Value Date   CHOL 148 04/22/2020   HDL 68.40 04/22/2020   LDLCALC 69 04/22/2020   TRIG 50.0 04/22/2020   CHOLHDL 2 04/22/2020  Eats a lot of fatty foods /eats unhealthy lunch for the most part  No regular exercise due to schedule  Would like to start  Patient Active Problem List   Diagnosis Date Noted   Screen for STD (sexually transmitted disease) 07/23/2021   Tachycardia 07/09/2021   Non-intractable vomiting 04/25/2021   Close exposure to COVID-19 virus 04/25/2021   Iron deficiency 12/02/2020   Uterine fibroid 09/02/2020   Stress reaction 09/02/2020   Heavy menses 09/02/2020   Abdominal pain, diffuse 06/26/2020   Iron deficiency anemia 04/28/2020   Dry mouth 03/13/2019   Encounter for screening mammogram for breast cancer 08/01/2018   Encounter for annual routine gynecological examination 08/01/2018   Routine general medical  examination at a health care facility 05/28/2017   Fibrocystic breast changes of both breasts 11/21/2013   Encounter for other general counseling or advice on contraception 06/12/2013   Past Medical History:  Diagnosis Date   Iron deficiency anemia due to chronic blood loss    Lump or mass in breast 2013   right    Pap smear of cervix with ASCUS, cannot exclude HGSIL    No past surgical history on file. Social History   Tobacco Use   Smoking status: Never   Smokeless tobacco: Never  Substance Use Topics   Alcohol use: Yes    Alcohol/week: 0.0 standard drinks    Comment: socially   Drug use: No   Family History  Problem Relation Age of Onset   Lung cancer Maternal Aunt    CAD Maternal Uncle    No Known Allergies Current Outpatient Medications on  File Prior to Visit  Medication Sig Dispense Refill   acetic acid 5 % SOLN Apply 1 application topically.     iron polysaccharides (NIFEREX) 150 MG capsule Take 1 capsule (150 mg total) by mouth 2 (two) times daily. 60 capsule 5   NIKKI 3-0.02 MG tablet Take 1 tablet by mouth daily.     omeprazole (PRILOSEC) 20 MG capsule Take 1 capsule (20 mg total) by mouth daily. 30 capsule 4   ondansetron (ZOFRAN) 4 MG tablet Take 1 tablet (4 mg total) by mouth every 8 (eight) hours as needed for nausea or vomiting. 20 tablet 0   tranexamic acid (LYSTEDA) 650 MG TABS tablet Take by mouth.     tretinoin (RETIN-A) 0.1 % cream Apply 1 application topically every other day.     No current facility-administered medications on file prior to visit.    Review of Systems  Constitutional:  Negative for activity change, appetite change, fatigue, fever and unexpected weight change.  HENT:  Negative for congestion, ear pain, rhinorrhea, sinus pressure and sore throat.   Eyes:  Negative for pain, redness and visual disturbance.  Respiratory:  Negative for cough, shortness of breath and wheezing.   Cardiovascular:  Negative for chest pain and palpitations.  Gastrointestinal:  Negative for abdominal pain, blood in stool, constipation and diarrhea.  Endocrine: Negative for polydipsia and polyuria.  Genitourinary:  Positive for menstrual problem. Negative for dysuria, frequency and urgency.       Large uterine fibroid   Musculoskeletal:  Negative for arthralgias, back pain and myalgias.  Skin:  Negative for pallor and rash.  Allergic/Immunologic: Negative for environmental allergies.  Neurological:  Negative for dizziness, syncope and headaches.  Hematological:  Negative for adenopathy. Does not bruise/bleed easily.  Psychiatric/Behavioral:  Negative for decreased concentration and dysphoric mood. The patient is not nervous/anxious.       Objective:   Physical Exam Constitutional:      General: She is not in acute  distress.    Appearance: Normal appearance. She is well-developed and normal weight. She is not ill-appearing or diaphoretic.  HENT:     Head: Normocephalic and atraumatic.     Right Ear: Tympanic membrane, ear canal and external ear normal.     Left Ear: Tympanic membrane, ear canal and external ear normal.     Nose: Nose normal. No congestion.     Mouth/Throat:     Mouth: Mucous membranes are moist.     Pharynx: Oropharynx is clear. No posterior oropharyngeal erythema.  Eyes:     General: No scleral icterus.    Extraocular Movements:  Extraocular movements intact.     Conjunctiva/sclera: Conjunctivae normal.     Pupils: Pupils are equal, round, and reactive to light.  Neck:     Thyroid: No thyromegaly.     Vascular: No carotid bruit or JVD.  Cardiovascular:     Rate and Rhythm: Normal rate and regular rhythm.     Pulses: Normal pulses.     Heart sounds: Normal heart sounds.    No gallop.  Pulmonary:     Effort: Pulmonary effort is normal. No respiratory distress.     Breath sounds: Normal breath sounds. No wheezing.     Comments: Good air exch Chest:     Chest wall: No tenderness.  Abdominal:     General: Bowel sounds are normal. There is no distension or abdominal bruit.     Palpations: Abdomen is soft. There is no mass.     Tenderness: There is no abdominal tenderness.     Hernia: No hernia is present.     Comments: Suprapubic mass consistent with fibroid noted on abdominal exam  Genitourinary:    Comments: Breast and pelvic exam done by gyn    Musculoskeletal:        General: No tenderness. Normal range of motion.     Cervical back: Normal range of motion and neck supple. No rigidity. No muscular tenderness.     Right lower leg: No edema.     Left lower leg: No edema.  Lymphadenopathy:     Cervical: No cervical adenopathy.  Skin:    General: Skin is warm and dry.     Coloration: Skin is not pale.     Findings: No erythema or rash.     Comments: Mild acne on  back/face  Neurological:     Mental Status: She is alert. Mental status is at baseline.     Cranial Nerves: No cranial nerve deficit.     Motor: No abnormal muscle tone.     Coordination: Coordination normal.     Gait: Gait normal.     Deep Tendon Reflexes: Reflexes are normal and symmetric. Reflexes normal.  Psychiatric:        Mood and Affect: Mood normal.        Cognition and Memory: Cognition normal.          Assessment & Plan:   Problem List Items Addressed This Visit       Other   Routine general medical examination at a health care facility - Primary    Reviewed health habits including diet and exercise and skin cancer prevention Reviewed appropriate screening tests for age  Also reviewed health mt list, fam hx and immunization status , as well as social and family history   See HPI Labs ordered  Flu shot given  Is covid vaccinated, recommend bivalent booster Pap and gyn exam utd  (due for f/u of uterine fibroid and heavy menses and contraceptoin) Encouraged self breast exams Std screening is utd          Relevant Orders   CBC with Differential/Platelet (Completed)   Comprehensive metabolic panel (Completed)   Lipid panel (Completed)   TSH (Completed)   Iron deficiency anemia    From heavy mense Taking niferex 150 mg bid and under care of hematology  Cbc and iron level ordered today      Relevant Orders   Iron (Completed)

## 2021-08-14 NOTE — Patient Instructions (Addendum)
Consider adding some exercise  Consider eating a more healthy lunch  Drink water  Eat fruits and vegetables   Continue gyn follow up

## 2021-08-14 NOTE — Assessment & Plan Note (Signed)
Reviewed health habits including diet and exercise and skin cancer prevention Reviewed appropriate screening tests for age  Also reviewed health mt list, fam hx and immunization status , as well as social and family history   See HPI Labs ordered  Flu shot given  Is covid vaccinated, recommend bivalent booster Pap and gyn exam utd  (due for f/u of uterine fibroid and heavy menses and contraceptoin) Encouraged self breast exams Std screening is utd

## 2021-08-15 ENCOUNTER — Telehealth: Payer: Self-pay | Admitting: Family Medicine

## 2021-08-15 NOTE — Telephone Encounter (Signed)
Tried to call pt back but no answer. Left detailed VM, per DPR, relaying lab results.

## 2021-08-15 NOTE — Telephone Encounter (Signed)
Pt called in stated she 's returning call for the office

## 2021-09-18 ENCOUNTER — Other Ambulatory Visit: Payer: Self-pay | Admitting: *Deleted

## 2021-09-18 DIAGNOSIS — E611 Iron deficiency: Secondary | ICD-10-CM

## 2021-09-24 ENCOUNTER — Inpatient Hospital Stay: Payer: 59

## 2021-09-24 ENCOUNTER — Inpatient Hospital Stay: Payer: 59 | Admitting: Internal Medicine

## 2021-09-24 ENCOUNTER — Telehealth: Payer: Self-pay | Admitting: Internal Medicine

## 2021-09-24 NOTE — Telephone Encounter (Signed)
Pt called to reschedule her appt for today. Call back at [phone]

## 2021-09-29 ENCOUNTER — Ambulatory Visit: Payer: 59 | Admitting: Family Medicine

## 2021-10-06 ENCOUNTER — Ambulatory Visit: Payer: 59 | Admitting: Family Medicine

## 2021-10-09 ENCOUNTER — Ambulatory Visit: Payer: 59 | Admitting: Family Medicine

## 2021-10-17 ENCOUNTER — Inpatient Hospital Stay: Payer: 59 | Attending: Internal Medicine | Admitting: Internal Medicine

## 2021-10-17 ENCOUNTER — Inpatient Hospital Stay: Payer: 59

## 2021-10-17 NOTE — Progress Notes (Deleted)
[facility] NOTE  Patient Care Team: Tower, Wynelle Fanny, MD as PCP - General (Family Medicine) Bary Castilla, Forest Gleason, MD as Consulting Physician (General Surgery)  CHIEF COMPLAINTS/PURPOSE OF CONSULTATION: ANEMIA   HEMATOLOGY HISTORY:  # CHRONIC ANEMIA [july2021-] Hb 10; Feritin ~5.[dec 4888] - on PO Iron/NO IV venofer.   #Heavy menstrual cycles:  inconsistent with BCPs.   HISTORY OF PRESENTING ILLNESS:  Christine Carr 26 y.o.  female iron deficient anemia is here for follow-up.  Patient denies any unusual shortness of breath or cough.  Mild fatigue.  Continues to have intermittent menstrual heavy periods  Review of Systems  Constitutional:  Positive for malaise/fatigue. Negative for chills, diaphoresis, fever and weight loss.  HENT:  Negative for nosebleeds and sore throat.   Eyes:  Negative for double vision.  Respiratory:  Negative for cough, hemoptysis, sputum production, shortness of breath and wheezing.   Cardiovascular:  Negative for chest pain, palpitations, orthopnea and leg swelling.  Gastrointestinal:  Negative for abdominal pain, blood in stool, constipation, diarrhea, heartburn, melena, nausea and vomiting.  Genitourinary:  Negative for dysuria, frequency and urgency.  Musculoskeletal:  Negative for back pain and joint pain.  Skin: Negative.  Negative for itching and rash.  Neurological:  Negative for dizziness, tingling, focal weakness, weakness and headaches.  Endo/Heme/Allergies:  Does not bruise/bleed easily.  Psychiatric/Behavioral:  Negative for depression. The patient is not nervous/anxious and does not have insomnia.    MEDICAL HISTORY:  Past Medical History:  Diagnosis Date   Iron deficiency anemia due to chronic blood loss    Lump or mass in breast 2013   right    Pap smear of cervix with ASCUS, cannot exclude HGSIL     SURGICAL HISTORY: No past surgical history on file.  SOCIAL HISTORY: Social History   Socioeconomic History    Marital status: Single    Spouse name: Not on file   Number of children: Not on file   Years of education: Not on file   Highest education level: Not on file  Occupational History   Not on file  Tobacco Use   Smoking status: Never   Smokeless tobacco: Never  Substance and Sexual Activity   Alcohol use: Yes    Alcohol/week: 0.0 standard drinks    Comment: socially   Drug use: No   Sexual activity: Never  Other Topics Concern   Not on file  Social History Narrative   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 No children; lives in [place].    Social Determinants of Health   Financial Resource Strain: Not on file  Food Insecurity: Not on file  Transportation Needs: Not on file  Physical Activity: Not on file  Stress: Not on file  Social Connections: Not on file  Intimate Partner Violence: Not on file    FAMILY HISTORY: Family History  Problem Relation Age of Onset   Lung cancer Maternal Aunt    CAD Maternal Uncle     ALLERGIES:  has No Known Allergies.  MEDICATIONS:  Current Outpatient Medications  Medication Sig Dispense Refill   acetic acid 5 % SOLN Apply 1 application topically.     iron polysaccharides (NIFEREX) 150 MG capsule Take 1 capsule (150 mg total) by mouth 2 (two) times daily. 60 capsule 5   NIKKI 3-0.02 MG tablet Take 1 tablet by mouth daily.     omeprazole (PRILOSEC) 20 MG capsule Take 1 capsule (20 mg total) by mouth daily. [place]  capsule 4   ondansetron (ZOFRAN) 4 MG tablet Take 1 tablet (4 mg total) by mouth every 8 (eight) hours as needed for nausea or vomiting. 20 tablet 0   tranexamic acid (LYSTEDA) 650 MG TABS tablet Take by mouth.     tretinoin (RETIN-A) 0.1 % cream Apply 1 application topically every other day.     No current facility-administered medications for this visit.      PHYSICAL EXAMINATION:   There were no vitals filed for this visit.  There were no vitals filed for this  visit.   Physical Exam HENT:     Head: Normocephalic and atraumatic.     Mouth/Throat:     Pharynx: No oropharyngeal exudate.  Eyes:     Pupils: Pupils are equal, round, and reactive to light.  Cardiovascular:     Rate and Rhythm: Normal rate and regular rhythm.  Pulmonary:     Effort: No respiratory distress.     Breath sounds: No wheezing.  Abdominal:     General: Bowel sounds are normal. There is no distension.     Palpations: Abdomen is soft. There is no mass.     Tenderness: There is no abdominal tenderness. There is no guarding or rebound.  Musculoskeletal:        General: No tenderness. Normal range of motion.     Cervical back: Normal range of motion and neck supple.  Skin:    General: Skin is warm.  Neurological:     Mental Status: She is alert and oriented to person, place, and time.  Psychiatric:        Mood and Affect: Affect normal.    LABORATORY DATA:  I have reviewed the data as listed Lab Results  Component Value Date   WBC 4.3 08/14/2021   HGB 10.5 (L) 08/14/2021   HCT 33.3 (L) 08/14/2021   MCV 80.5 08/14/2021   PLT 471.0 (H) 08/14/2021   Recent Labs    12/02/20 1442 03/24/21 1223 08/14/21 0905  NA 135 133* 136  K 4.1 3.5 3.8  CL 101 104 107  CO2 22 20* 22  GLUCOSE 126* 122* 86  BUN 13 14 18   CREATININE 0.69 0.73 0.78  CALCIUM 9.0 9.2 8.8  GFRNONAA >60 >60  --   PROT 8.5*  --  7.6  ALBUMIN 4.2  --  3.9  AST 18  --  16  ALT 13  --  12  ALKPHOS 49  --  46  BILITOT 0.5  --  0.4      No results found.  No problem-specific Assessment & Plan notes found for this encounter.  All questions were answered. The patient knows to call the clinic with any problems, questions or concerns.    Cammie Sickle, MD 10/17/2021 10:18 AM

## 2021-10-17 NOTE — Assessment & Plan Note (Deleted)
#  Iron deficiency anemia secondary to chronic blood loss [heavy menstrual cycles]; hemoglobin around 10; ferritin~5 [dec 2021].  Patient tolerating p.o. iron fairly well  Moderately symptomatic.   # Discussed regarding possible IV iron infusions.  Discussed the potential acute infusion reactions with IV iron; which are quite rare.  Await labs from today; decide if she needs any IV iron infusions.  # heavy menstrual cycles: continue  BCPs; recommend compliance   # DISPOSITION:note to work # no infusion today # follow up in 6 months- MD; labs- cbc/cmp;iron studies;possible venofer-Dr.B

## 2021-10-27 ENCOUNTER — Other Ambulatory Visit: Payer: Self-pay | Admitting: Internal Medicine

## 2021-11-07 DIAGNOSIS — F419 Anxiety disorder, unspecified: Secondary | ICD-10-CM | POA: Diagnosis not present

## 2021-11-25 DIAGNOSIS — F419 Anxiety disorder, unspecified: Secondary | ICD-10-CM | POA: Diagnosis not present

## 2021-12-16 DIAGNOSIS — F419 Anxiety disorder, unspecified: Secondary | ICD-10-CM | POA: Diagnosis not present

## 2022-01-08 ENCOUNTER — Ambulatory Visit: Payer: 59 | Admitting: Family Medicine

## 2022-02-06 DIAGNOSIS — F419 Anxiety disorder, unspecified: Secondary | ICD-10-CM | POA: Diagnosis not present

## 2022-02-16 ENCOUNTER — Ambulatory Visit (INDEPENDENT_AMBULATORY_CARE_PROVIDER_SITE_OTHER): Payer: BC Managed Care – PPO | Admitting: Family Medicine

## 2022-02-16 ENCOUNTER — Encounter: Payer: Self-pay | Admitting: Family Medicine

## 2022-02-16 ENCOUNTER — Encounter: Payer: Self-pay | Admitting: Internal Medicine

## 2022-02-16 VITALS — BP 138/80 | HR 104 | Temp 98.7°F | Ht 66.5 in | Wt 165.8 lb

## 2022-02-16 DIAGNOSIS — D259 Leiomyoma of uterus, unspecified: Secondary | ICD-10-CM | POA: Diagnosis not present

## 2022-02-16 DIAGNOSIS — E611 Iron deficiency: Secondary | ICD-10-CM

## 2022-02-16 DIAGNOSIS — R2241 Localized swelling, mass and lump, right lower limb: Secondary | ICD-10-CM | POA: Diagnosis not present

## 2022-02-16 DIAGNOSIS — R000 Tachycardia, unspecified: Secondary | ICD-10-CM

## 2022-02-16 NOTE — Assessment & Plan Note (Signed)
From anxiety and anemia most likely  Last EKG is reassuring   Planning to continue therapy for anxiety  Start slowly with an exercise program  Disc outdoor and indoor options for this

## 2022-02-16 NOTE — Assessment & Plan Note (Signed)
Plans to re schedule her hematology appt   Low hb may add to chronic tachycardia

## 2022-02-16 NOTE — Assessment & Plan Note (Signed)
This appears to be getting larger  Pt is considering surgical options  Would like to have children later however

## 2022-02-16 NOTE — Patient Instructions (Signed)
Think about starting exercise program   Aim for 30 or more minutes daily of aerobic exercise / some muscle training would be good too   Equipment  Gym  Outside  Videos   Anemia also causes elevated heart rate  Re schedule your hematology visit   Continue therapy   The lump in your thigh is likely a cyst  If red/painful or bigger let me know  If it comes to a head- keep it clean and encourage drainage

## 2022-02-16 NOTE — Progress Notes (Signed)
Subjective:    Patient ID: Christine Carr, female    DOB: 16-Jul-1996, 26 y.o.   MRN: [id number]  HPI Pt presents for c/o lump in inner thigh  Wt Readings from Last 3 Encounters:  02/16/22 165 lb 12.8 oz (75.2 kg)  08/14/21 156 lb 6 oz (70.9 kg)  07/23/21 154 lb 2 oz (69.9 kg)   26.36 kg/m   1 week Knot  In crease of thigh  Is not painful  Not tender  Always comes up around time of cycle then goes away  Not red   No fevers  Feels good  No vaginal d/c or pain  No susp of STDs  No other spots   Periods are still heavy  Sees hematology for iron deficiency  She cancelled last appt and was not able to re schedule yet   Lab Results  Component Value Date   WBC 4.3 08/14/2021   HGB 10.5 (L) 08/14/2021   HCT 33.3 (L) 08/14/2021   MCV 80.5 08/14/2021   PLT 471.0 (H) 08/14/2021   Heart rate stays high when she is anxious at times Pulse Readings from Last 3 Encounters:  02/16/22 (!) 104  08/14/21 91  07/23/21 93   A nervous personality  Anxiety does not stop her from fxn  EKG was fine in November with visit   Going to to therapy for anxiety /stress     If anxious can go to 125       Review of Systems     Objective:   Physical Exam Constitutional:      General: She is not in acute distress.    Appearance: Normal appearance. She is well-developed and normal weight.  HENT:     Head: Normocephalic and atraumatic.  Eyes:     Conjunctiva/sclera: Conjunctivae normal.     Pupils: Pupils are equal, round, and reactive to light.  Neck:     Thyroid: No thyromegaly.     Vascular: No carotid bruit or JVD.  Cardiovascular:     Rate and Rhythm: Regular rhythm. Tachycardia present.     Heart sounds: Normal heart sounds.     No gallop.  Pulmonary:     Effort: Pulmonary effort is normal. No respiratory distress.     Breath sounds: Normal breath sounds. No wheezing or rales.  Abdominal:     General: There is no distension or abdominal bruit.     Palpations:  Abdomen is soft.     Tenderness: There is no abdominal tenderness. There is no guarding or rebound.     Comments: Uterine fibroid noted on exam   Musculoskeletal:     Cervical back: Normal range of motion and neck supple.     Right lower leg: No edema.     Left lower leg: No edema.  Lymphadenopathy:     Cervical: No cervical adenopathy.  Skin:    General: Skin is warm and dry.     Coloration: Skin is not pale.     Findings: No rash.     Comments: 0.5-1 cm superficial lump under skin in R inner thigh  Mild hyperpigmentation  Firm and mobile  Non tender, not fluctuant  No drainage or redness Consistent with cyst or scar from former cyst  Neurological:     Mental Status: She is alert.     Coordination: Coordination normal.     Deep Tendon Reflexes: Reflexes are normal and symmetric. Reflexes normal.  Psychiatric:        Mood and  Affect: Mood is anxious.        Cognition and Memory: Cognition and memory normal.     Comments: Pleasant Candidly discusses symptoms and stressors             Assessment & Plan:   Problem List Items Addressed This Visit       Genitourinary   Uterine fibroid    This appears to be getting larger  Pt is considering surgical options  Would like to have children later however         Other   Iron deficiency    Plans to re schedule her hematology appt   Low hb may add to chronic tachycardia      Lump of right thigh - Primary    Small lump just under skin consistent with cyst or old scarring from a cyst  Not currently infected or tender  Disc this with pt and what to do if it enlarges or becomes infected  Keep clean Warm compress prn Update if not starting to improve in a week or if worsening  Will monitor       Tachycardia    From anxiety and anemia most likely  Last EKG is reassuring   Planning to continue therapy for anxiety  Start slowly with an exercise program  Disc outdoor and indoor options for this

## 2022-02-16 NOTE — Assessment & Plan Note (Signed)
Small lump just under skin consistent with cyst or old scarring from a cyst  Not currently infected or tender  Disc this with pt and what to do if it enlarges or becomes infected  Keep clean Warm compress prn Update if not starting to improve in a week or if worsening  Will monitor

## 2022-03-06 DIAGNOSIS — F419 Anxiety disorder, unspecified: Secondary | ICD-10-CM | POA: Diagnosis not present

## 2022-03-20 DIAGNOSIS — F419 Anxiety disorder, unspecified: Secondary | ICD-10-CM | POA: Diagnosis not present

## 2022-04-07 DIAGNOSIS — F419 Anxiety disorder, unspecified: Secondary | ICD-10-CM | POA: Diagnosis not present

## 2022-04-28 DIAGNOSIS — F419 Anxiety disorder, unspecified: Secondary | ICD-10-CM | POA: Diagnosis not present

## 2022-05-26 DIAGNOSIS — F419 Anxiety disorder, unspecified: Secondary | ICD-10-CM | POA: Diagnosis not present

## 2022-06-16 DIAGNOSIS — F419 Anxiety disorder, unspecified: Secondary | ICD-10-CM | POA: Diagnosis not present

## 2022-06-30 DIAGNOSIS — F419 Anxiety disorder, unspecified: Secondary | ICD-10-CM | POA: Diagnosis not present

## 2022-07-21 ENCOUNTER — Ambulatory Visit (INDEPENDENT_AMBULATORY_CARE_PROVIDER_SITE_OTHER): Payer: BC Managed Care – PPO | Admitting: Family Medicine

## 2022-07-21 ENCOUNTER — Encounter: Payer: Self-pay | Admitting: Family Medicine

## 2022-07-21 VITALS — BP 118/60 | HR 109 | Temp 98.4°F | Ht 66.5 in | Wt 172.2 lb

## 2022-07-21 DIAGNOSIS — Z23 Encounter for immunization: Secondary | ICD-10-CM | POA: Diagnosis not present

## 2022-07-21 DIAGNOSIS — E611 Iron deficiency: Secondary | ICD-10-CM | POA: Diagnosis not present

## 2022-07-21 DIAGNOSIS — F430 Acute stress reaction: Secondary | ICD-10-CM | POA: Diagnosis not present

## 2022-07-21 MED ORDER — FERROUS SULFATE 220 (44 FE) MG/5ML PO SOLN: 220.0000 mg | Freq: Every day | ORAL | 1 refills | Status: DC

## 2022-07-21 MED ORDER — BUSPIRONE HCL 15 MG PO TABS: 7.5000 mg | ORAL_TABLET | Freq: Two times a day (BID) | ORAL | 5 refills | Status: DC

## 2022-07-21 NOTE — Progress Notes (Signed)
Subjective:    Patient ID: Christine Carr, female    DOB: Aug 24, 1996, 26 y.o.   MRN: [id number]  HPI Pt presents for anxiety symptoms   Wt Readings from Last 3 Encounters:  07/21/22 172 lb 3.2 oz (78.1 kg)  02/16/22 165 lb 12.8 oz (75.2 kg)  08/14/21 156 lb 6 oz (70.9 kg)   27.38 kg/m  Stressed out more than usual for a few months  Has a lot going on   No danger Not unsafe   Stress: work, family , grief  Grandfather has dementia- this is hard to deal with  Helps care for grandmother  They need more help lately  Needs help with anxiety  Feels drained /tired  Nervous and jittery on inside  Hard to focus and hard to be in the moment  Cannot stop her brain   Tends to keep a high HR when she feels anxious Cannot sleep: problems staying asleep  No appetite   A little bit down  No thoughts of self harm  Sometimes hard to get joy from things   Does not have a lot of support  Does not want to lean on parents because they are going through a lot   Has a therapist  Goes every 2 weeks Has learned a lot   Coping mechanisms : shopping (has to be careful)  Binge watches TV  No exercise  Tries to read   Alcohol- occasionally  No marijuana at all  1 cup coffee in am and she tolerates well   Has not been on medicines in the past   Iron def anemia  Did not go to hematologist  Gyn in [place] is bigger   Lab Results  Component Value Date   WBC 4.3 08/14/2021   HGB 10.5 (L) 08/14/2021   HCT 33.3 (L) 08/14/2021   MCV 80.5 08/14/2021   PLT 471.0 (H) 08/14/2021   Patient Active Problem List   Diagnosis Date Noted   Lump of right thigh 02/16/2022   Screen for STD (sexually transmitted disease) 07/23/2021   Tachycardia 07/09/2021   Non-intractable vomiting 04/25/2021   Close exposure to COVID-19 virus 04/25/2021   Iron deficiency 12/02/2020   Uterine fibroid 09/02/2020   Stress reaction 09/02/2020   Heavy menses 09/02/2020   Abdominal pain, diffuse  06/26/2020   Iron deficiency anemia 04/28/2020   Dry mouth 03/13/2019   Encounter for screening mammogram for breast cancer 08/01/2018   Encounter for annual routine gynecological examination 08/01/2018   Routine general medical examination at a health care facility 05/28/2017   Fibrocystic breast changes of both breasts 11/21/2013   Encounter for other general counseling or advice on contraception 06/12/2013   Past Medical History:  Diagnosis Date   Iron deficiency anemia due to chronic blood loss    Lump or mass in breast 2013   right    Pap smear of cervix with ASCUS, cannot exclude HGSIL    History reviewed. No pertinent surgical history. Social History   Tobacco Use   Smoking status: Never   Smokeless tobacco: Never  Substance Use Topics   Alcohol use: Yes    Alcohol/week: 0.0 standard drinks of alcohol    Comment: socially   Drug use: No   Family History  Problem Relation Age of Onset   Lung cancer Maternal Aunt    CAD Maternal Uncle    No Known Allergies Current Outpatient Medications on File Prior to Visit  Medication Sig Dispense Refill  iron polysaccharides (NIFEREX) 150 MG capsule TAKE 1 CAPSULE BY MOUTH TWICE A DAY 180 capsule 1   tretinoin (RETIN-A) 0.1 % cream Apply 1 application topically every other day.     No current facility-administered medications on file prior to visit.     Review of Systems  Constitutional:  Positive for fatigue. Negative for activity change, appetite change, fever and unexpected weight change.  HENT:  Negative for congestion, ear pain, rhinorrhea, sinus pressure and sore throat.   Eyes:  Negative for pain, redness and visual disturbance.  Respiratory:  Negative for cough, shortness of breath and wheezing.   Cardiovascular:  Negative for chest pain and palpitations.  Gastrointestinal:  Negative for abdominal pain, blood in stool, constipation and diarrhea.  Endocrine: Negative for polydipsia and polyuria.  Genitourinary:   Negative for dysuria, frequency and urgency.  Musculoskeletal:  Negative for arthralgias, back pain and myalgias.  Skin:  Negative for pallor and rash.  Allergic/Immunologic: Negative for environmental allergies.  Neurological:  Negative for dizziness, syncope and headaches.  Hematological:  Negative for adenopathy. Does not bruise/bleed easily.  Psychiatric/Behavioral:  Positive for decreased concentration, dysphoric mood and sleep disturbance. Negative for agitation, behavioral problems, confusion, hallucinations, self-injury and suicidal ideas. The patient is nervous/anxious.        Objective:   Physical Exam Constitutional:      General: She is not in acute distress.    Appearance: Normal appearance. She is well-developed and normal weight. She is not ill-appearing or diaphoretic.  HENT:     Head: Normocephalic and atraumatic.  Eyes:     Conjunctiva/sclera: Conjunctivae normal.     Pupils: Pupils are equal, round, and reactive to light.  Neck:     Thyroid: No thyromegaly.     Vascular: No carotid bruit or JVD.  Cardiovascular:     Rate and Rhythm: Regular rhythm. Tachycardia present.     Heart sounds: Normal heart sounds.     No gallop.  Pulmonary:     Effort: Pulmonary effort is normal. No respiratory distress.     Breath sounds: Normal breath sounds. No wheezing or rales.  Abdominal:     General: There is no distension or abdominal bruit.     Palpations: Abdomen is soft.  Musculoskeletal:     Cervical back: Normal range of motion and neck supple.     Right lower leg: No edema.     Left lower leg: No edema.  Lymphadenopathy:     Cervical: No cervical adenopathy.  Skin:    General: Skin is warm and dry.     Coloration: Skin is not pale.     Findings: No rash.  Neurological:     Mental Status: She is alert.     Coordination: Coordination normal.     Deep Tendon Reflexes: Reflexes are normal and symmetric. Reflexes normal.  Psychiatric:        Attention and  Perception: Attention normal.        Mood and Affect: Mood is anxious. Mood is not depressed.        Speech: Speech normal.        Behavior: Behavior normal.        Thought Content: Thought content is not paranoid or delusional. Thought content does not include suicidal ideation.        Cognition and Memory: Cognition and memory normal.     Comments: Candidly discusses symptoms and stressors             Assessment &  Plan:   Problem List Items Addressed This Visit       Other   Iron deficiency    From heavy menses/fibroids  Has new gyn appt scheduled in jan and wants to avoid surgery  Never went to heme because she is scared she will have a transfusion   Dislikes oral iron  Would like to look for a liquid form to see if better tolerated  Enc her to do this  Sent ferusol 220 mg daily solution to see how she tolerates We can re check cbc at f/u in 6 wk      Stress reaction - Primary    With anxious symptoms  Rev stressors  Reviewed stressors/ coping techniques/symptoms/ support sources/ tx options and side effects in detail today  Enc her to continue counseling Discussed coping tech and self care Make effort to exercise and get outdoors  Disc med options  Px buspar 7.5 mg bid (better on schedule but can take prn)  Discussed expectations of this medication including time to effectiveness and mechanism of action, also poss of side effects (early and late)- including mental fuzziness, weight or appetite change, nausea and poss of worse dep or anxiety (even suicidal thoughts)  Pt voiced understanding and will stop med and update if this occurs   F/u 6 weeks      Relevant Medications   busPIRone (BUSPAR) 15 MG tablet   Other Visit Diagnoses     Need for immunization against influenza       Relevant Orders   Flu Vaccine QUAD 34moIM (Fluarix, Fluzone & Alfiuria Quad PF) (Completed)

## 2022-07-21 NOTE — Assessment & Plan Note (Signed)
With anxious symptoms  Rev stressors  Reviewed stressors/ coping techniques/symptoms/ support sources/ tx options and side effects in detail today  Enc her to continue counseling Discussed coping tech and self care Make effort to exercise and get outdoors  Disc med options  Px buspar 7.5 mg bid (better on schedule but can take prn)  Discussed expectations of this medication including time to effectiveness and mechanism of action, also poss of side effects (early and late)- including mental fuzziness, weight or appetite change, nausea and poss of worse dep or anxiety (even suicidal thoughts)  Pt voiced understanding and will stop med and update if this occurs   F/u 6 weeks

## 2022-07-21 NOTE — Patient Instructions (Addendum)
Think about exercise  Get outdoors / walking - it really helps   You need to get back on iron  Look for liquid iron over the counter or on line- let me know what you find  I can look as well    Don't be afraid of IV iron if you need it   For anxiety start buspar  1/2 pill twice daily    If any side effect hold it and let us know  If you feel worse instead of better stop it  If you ever feel you could hurt yourself go to the ER and let us know   Continue your counseling   Follow up here in 6 weeks

## 2022-07-21 NOTE — Assessment & Plan Note (Signed)
From heavy menses/fibroids  Has new gyn appt scheduled in jan and wants to avoid surgery  Never went to heme because she is scared she will have a transfusion   Dislikes oral iron  Would like to look for a liquid form to see if better tolerated  Enc her to do this  Sent ferusol 220 mg daily solution to see how she tolerates We can re check cbc at f/u in 6 wk

## 2022-07-24 DIAGNOSIS — F419 Anxiety disorder, unspecified: Secondary | ICD-10-CM | POA: Diagnosis not present

## 2022-08-04 DIAGNOSIS — F411 Generalized anxiety disorder: Secondary | ICD-10-CM | POA: Diagnosis not present

## 2022-08-13 ENCOUNTER — Other Ambulatory Visit: Payer: Self-pay | Admitting: Family Medicine

## 2022-08-18 DIAGNOSIS — F411 Generalized anxiety disorder: Secondary | ICD-10-CM | POA: Diagnosis not present

## 2022-08-24 ENCOUNTER — Other Ambulatory Visit: Payer: Self-pay | Admitting: Family Medicine

## 2022-09-02 ENCOUNTER — Ambulatory Visit: Payer: BC Managed Care – PPO | Admitting: Family Medicine

## 2022-09-03 ENCOUNTER — Ambulatory Visit: Payer: BC Managed Care – PPO | Admitting: Family Medicine

## 2022-09-10 ENCOUNTER — Encounter: Payer: Self-pay | Admitting: Internal Medicine

## 2022-09-10 ENCOUNTER — Encounter: Payer: Self-pay | Admitting: Family

## 2022-09-10 ENCOUNTER — Ambulatory Visit (INDEPENDENT_AMBULATORY_CARE_PROVIDER_SITE_OTHER): Payer: BC Managed Care – PPO | Admitting: Family

## 2022-09-10 VITALS — BP 128/62 | HR 127 | Temp 100.4°F | Ht 66.5 in | Wt 171.0 lb

## 2022-09-10 DIAGNOSIS — J069 Acute upper respiratory infection, unspecified: Secondary | ICD-10-CM

## 2022-09-10 DIAGNOSIS — R509 Fever, unspecified: Secondary | ICD-10-CM

## 2022-09-10 DIAGNOSIS — R52 Pain, unspecified: Secondary | ICD-10-CM

## 2022-09-10 DIAGNOSIS — J029 Acute pharyngitis, unspecified: Secondary | ICD-10-CM

## 2022-09-10 LAB — POC COVID19 BINAXNOW: SARS Coronavirus 2 Ag: NEGATIVE

## 2022-09-10 LAB — POCT RAPID STREP A (OFFICE): Rapid Strep A Screen: NEGATIVE

## 2022-09-10 MED ORDER — OSELTAMIVIR PHOSPHATE 75 MG PO CAPS: 75.0000 mg | ORAL_CAPSULE | Freq: Two times a day (BID) | ORAL | 0 refills | Status: AC

## 2022-09-10 NOTE — Assessment & Plan Note (Signed)
Assessment influenza due to fever and chills pending flu result however this is a send out and will take a few days and patient is currently in timeframe for treatment with Tamiflu.  Going to go ahead and give Tamiflu 75 mg If flu negative will stop tamiflu Covid and strep negative in office.   Advised patient on supportive measures:  Be sure to rest, drink plenty of fluids, and use tylenol or ibuprofen as needed for pain. Follow up if fever >101, if symptoms worsen or if symptoms are not improved in 3 days. Patient verbalizes understanding.

## 2022-09-10 NOTE — Patient Instructions (Signed)
I suspect that you might have the flu but we will not know the results right away.  I do want to start Tamiflu prophylactically considering you were negative for COVID and strep and you have a high fever.  Lets start this and then pending the results we may continue or stop the Tamiflu.   Regards,   Eugenia Pancoast FNP-C

## 2022-09-10 NOTE — Progress Notes (Signed)
Established Patient Office Visit  Subjective:  Patient ID: Christine Carr, female    DOB: December 28, 1995  Age: 27 y.o. MRN: [id number]  CC:  Chief Complaint  Patient presents with   Sore Throat    Since yesterday   Headache    x3d   Chills   Generalized Body Aches   Fever    100.8 today    HPI Christine Carr is here today with concerns.   Two days ago started with fever, chills, sore throat, sinus headache in frontal area, and body aches. Fever 100.8 today.   Taking tylenol and alka seltzer tablets with no relief.  Dad with covid right before christmas.   Has not recently covid tested.  Slight dry non productive cough.   Past Medical History:  Diagnosis Date   Iron deficiency anemia due to chronic blood loss    Lump or mass in breast 2013   right    Pap smear of cervix with ASCUS, cannot exclude HGSIL     History reviewed. No pertinent surgical history.  Family History  Problem Relation Age of Onset   Lung cancer Maternal Aunt    CAD Maternal Uncle     Social History   Socioeconomic History   Marital status: Single    Spouse name: Not on file   Number of children: Not on file   Years of education: Not on file   Highest education level: Not on file  Occupational History   Not on file  Tobacco Use   Smoking status: Never   Smokeless tobacco: Never  Substance and Sexual Activity   Alcohol use: Yes    Alcohol/week: 0.0 standard drinks of alcohol    Comment: socially   Drug use: No   Sexual activity: Never  Other Topics Concern   Not on file  Social History Narrative   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 No children; lives in [place].    Social Determinants of Health   Financial Resource Strain: Not on file  Food Insecurity: Not on file  Transportation Needs: Not on file  Physical Activity: Not on file  Stress: Not on file  Social Connections: Not on file  Intimate Partner Violence: Not on file    Outpatient  Medications Prior to Visit  Medication Sig Dispense Refill   busPIRone (BUSPAR) 15 MG tablet TAKE 1/2 TABLET (7.5 MG TOTAL) BY MOUTH TWICE A DAY 90 tablet 1   ferrous sulfate 220 (44 Fe) MG/5ML solution TAKE 5 MLS (220 MG TOTAL) BY MOUTH DAILY. 450 mL 0   tretinoin (RETIN-A) 0.1 % cream Apply 1 application topically every other day.     No facility-administered medications prior to visit.    No Known Allergies      Objective:    Physical Exam Constitutional:      General: She is not in acute distress.    Appearance: Normal appearance. She is not ill-appearing.  HENT:     Right Ear: Tympanic membrane normal.     Left Ear: Tympanic membrane normal.     Nose: Mucosal edema and congestion present. No rhinorrhea.     Right Turbinates: Not enlarged or swollen.     Left Turbinates: Not enlarged or swollen.     Right Sinus: No maxillary sinus tenderness or frontal sinus tenderness.     Left Sinus: No maxillary sinus tenderness or frontal sinus tenderness.     Mouth/Throat:     Mouth: Mucous membranes are moist.  Pharynx: Posterior oropharyngeal erythema present. No pharyngeal swelling or oropharyngeal exudate.     Tonsils: No tonsillar exudate or tonsillar abscesses. 2+ on the right. 2+ on the left.  Eyes:     Extraocular Movements: Extraocular movements intact.     Conjunctiva/sclera: Conjunctivae normal.     Pupils: Pupils are equal, round, and reactive to light.  Neck:     Thyroid: No thyroid mass.  Cardiovascular:     Rate and Rhythm: Normal rate and regular rhythm.  Pulmonary:     Effort: Pulmonary effort is normal.     Breath sounds: Normal breath sounds.  Lymphadenopathy:     Cervical:     Right cervical: No superficial cervical adenopathy.    Left cervical: No superficial cervical adenopathy.  Neurological:     Mental Status: She is alert.     BP 128/62   Pulse (!) 127   Temp (!) 100.4 F (38 C) (Oral)   Ht 5' 6.5" (1.689 m)   Wt 171 lb (77.6 kg)   SpO2  98%   BMI 27.19 kg/m  Wt Readings from Last 3 Encounters:  09/10/22 171 lb (77.6 kg)  07/21/22 172 lb 3.2 oz (78.1 kg)  02/16/22 165 lb 12.8 oz (75.2 kg)     Health Maintenance Due  Topic Date Due   COVID-19 Vaccine (4 - 2023-24 season) 05/08/2022    There are no preventive care reminders to display for this patient.  Lab Results  Component Value Date   TSH 0.67 08/14/2021   Lab Results  Component Value Date   WBC 4.3 08/14/2021   HGB 10.5 (L) 08/14/2021   HCT 33.3 (L) 08/14/2021   MCV 80.5 08/14/2021   PLT 471.0 (H) 08/14/2021   Lab Results  Component Value Date   NA 136 08/14/2021   K 3.8 08/14/2021   CO2 22 08/14/2021   GLUCOSE 86 08/14/2021   BUN 18 08/14/2021   CREATININE 0.78 08/14/2021   BILITOT 0.4 08/14/2021   ALKPHOS 46 08/14/2021   AST 16 08/14/2021   ALT 12 08/14/2021   PROT 7.6 08/14/2021   ALBUMIN 3.9 08/14/2021   CALCIUM 8.8 08/14/2021   ANIONGAP 9 03/24/2021   GFR 105.71 08/14/2021   No results found for: "HGBA1C"    Assessment & Plan:   Problem List Items Addressed This Visit       Respiratory   Upper respiratory tract infection    Assessment influenza due to fever and chills pending flu result however this is a send out and will take a few days and patient is currently in timeframe for treatment with Tamiflu.  Going to go ahead and give Tamiflu 75 mg If flu negative will stop tamiflu Covid and strep negative in office.   Advised patient on supportive measures:  Be sure to rest, drink plenty of fluids, and use tylenol or ibuprofen as needed for pain. Follow up if fever >101, if symptoms worsen or if symptoms are not improved in 3 days. Patient verbalizes understanding.         Relevant Medications   oseltamivir (TAMIFLU) 75 MG capsule   Other Visit Diagnoses     Fever, unspecified fever cause    -  Primary   Relevant Medications   oseltamivir (TAMIFLU) 75 MG capsule   Other Relevant Orders   POC COVID-19 (Completed)    COVID-19, Flu A+B and RSV   Generalized body aches       Relevant Medications   oseltamivir (TAMIFLU) 75 MG  capsule   Other Relevant Orders   POC COVID-19 (Completed)   COVID-19, Flu A+B and RSV   Sore throat       Relevant Orders   POCT rapid strep A (Completed)   COVID-19, Flu A+B and RSV       Meds ordered this encounter  Medications   oseltamivir (TAMIFLU) 75 MG capsule    Sig: Take 1 capsule (75 mg total) by mouth 2 (two) times daily for 5 days.    Dispense:  10 capsule    Refill:  0    Order Specific Question:   Supervising Provider    Answer:   Diona Browner, AMY E [[id number]    Follow-up: Return for f/u with primary care provider if no improvement.    Eugenia Pancoast, FNP

## 2022-09-11 LAB — COVID-19, FLU A+B AND RSV
Influenza A, NAA: NOT DETECTED
Influenza B, NAA: NOT DETECTED
RSV, NAA: NOT DETECTED
SARS-CoV-2, NAA: NOT DETECTED

## 2022-09-29 DIAGNOSIS — F411 Generalized anxiety disorder: Secondary | ICD-10-CM | POA: Diagnosis not present

## 2022-10-05 ENCOUNTER — Encounter: Payer: Self-pay | Admitting: Family Medicine

## 2022-10-06 IMAGING — DX DG ABDOMEN 2V
4 series · 4 of 4 positions shown · non-contrast
Comparison: None.

CLINICAL DATA: Right-sided pain

EXAM:
ABDOMEN - 2 VIEW

[abdomen erect (1 of 2)]
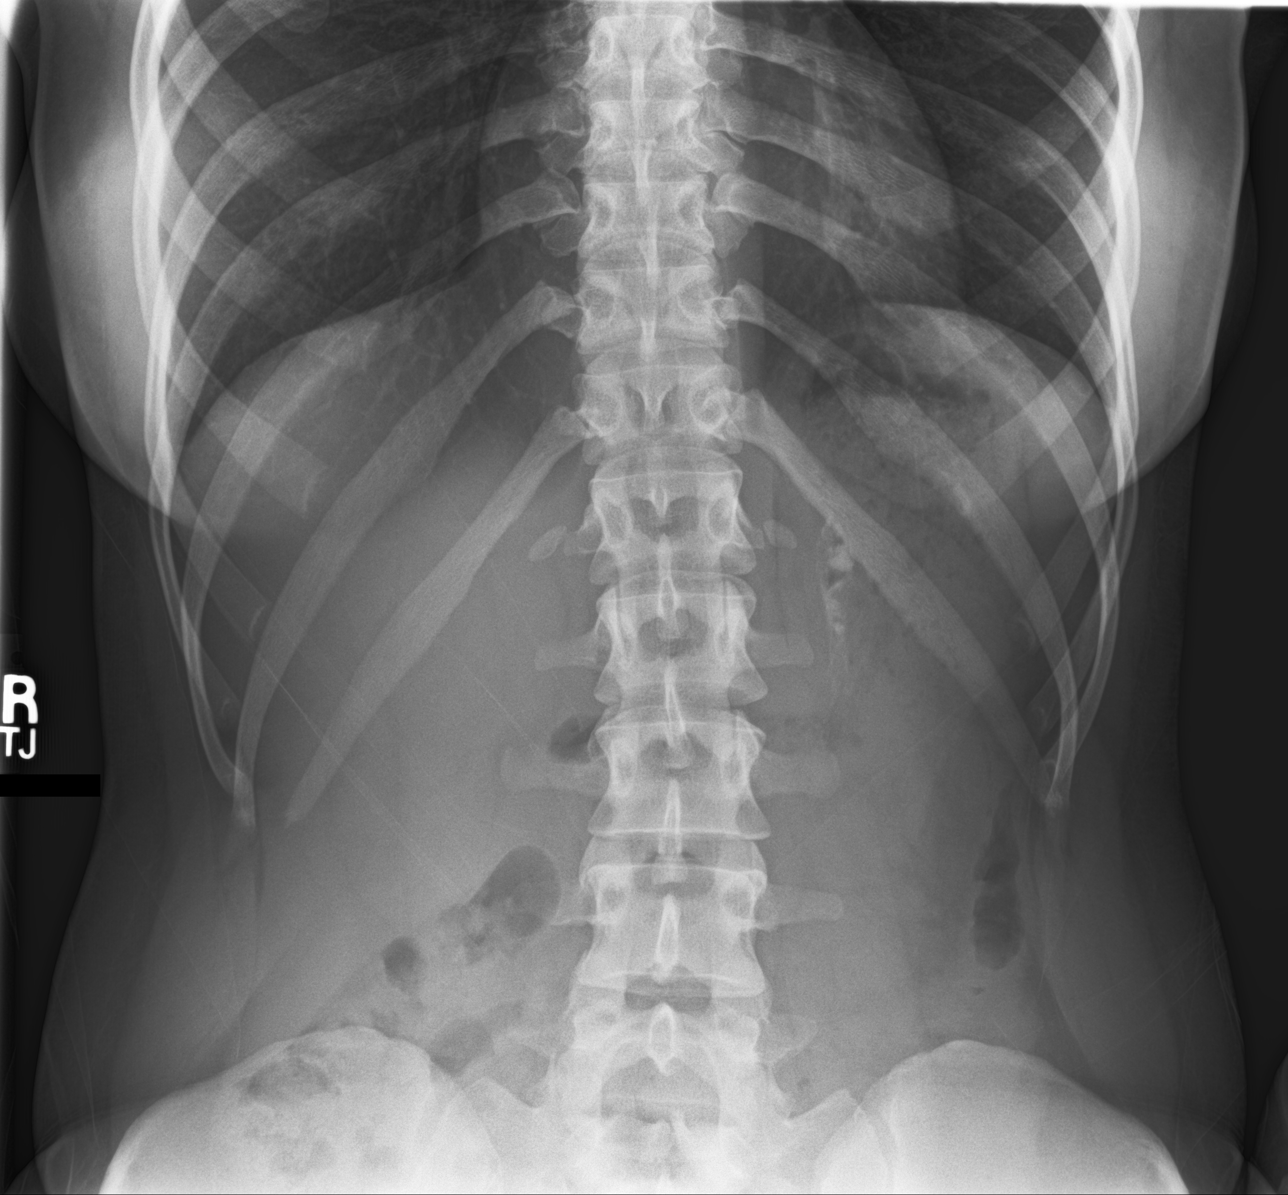

[abdomen supine (1 of 2)]
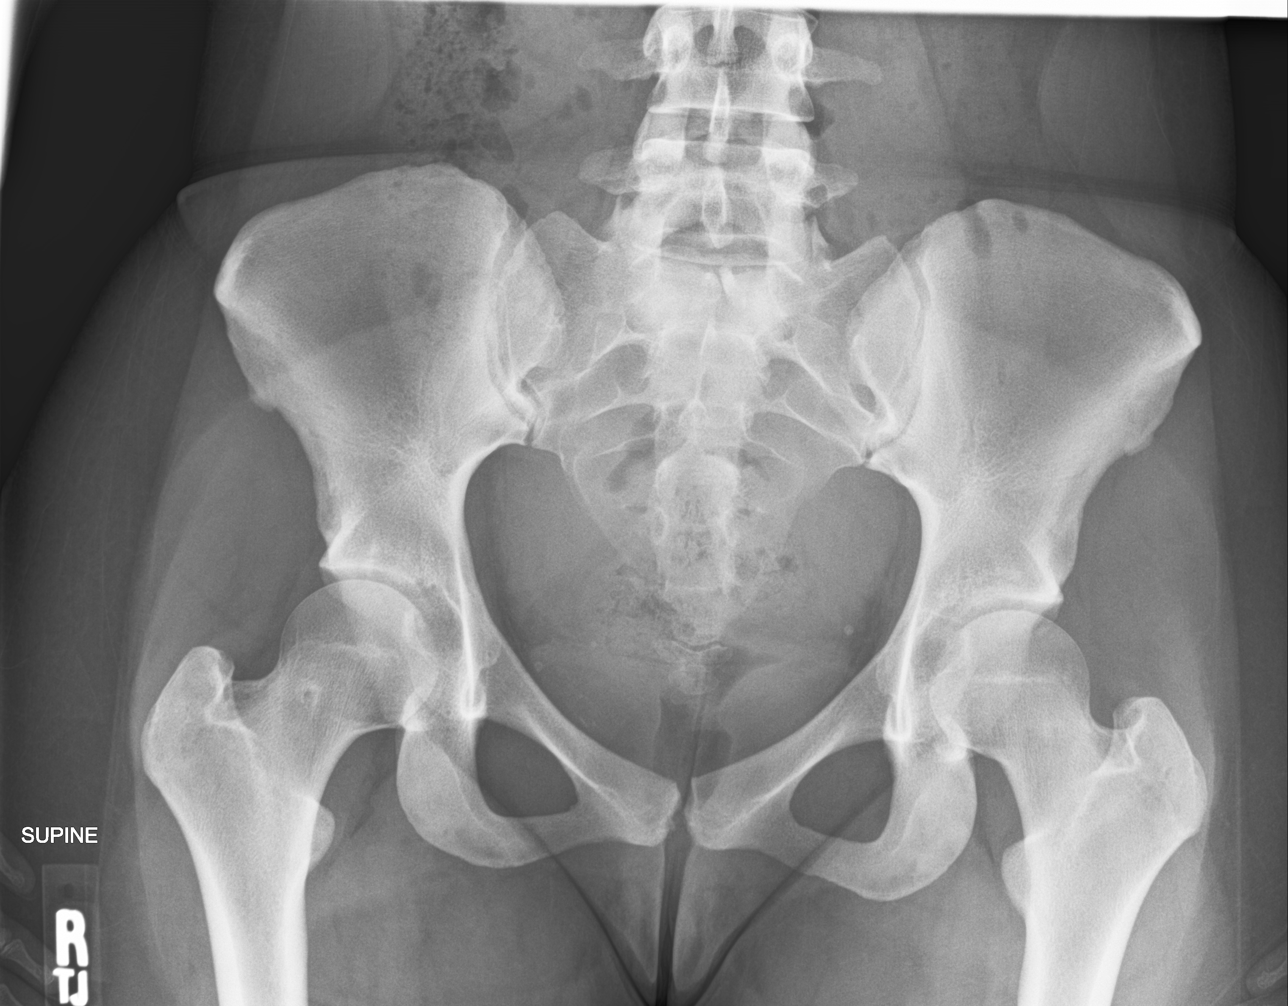

[abdomen supine (2 of 2)]
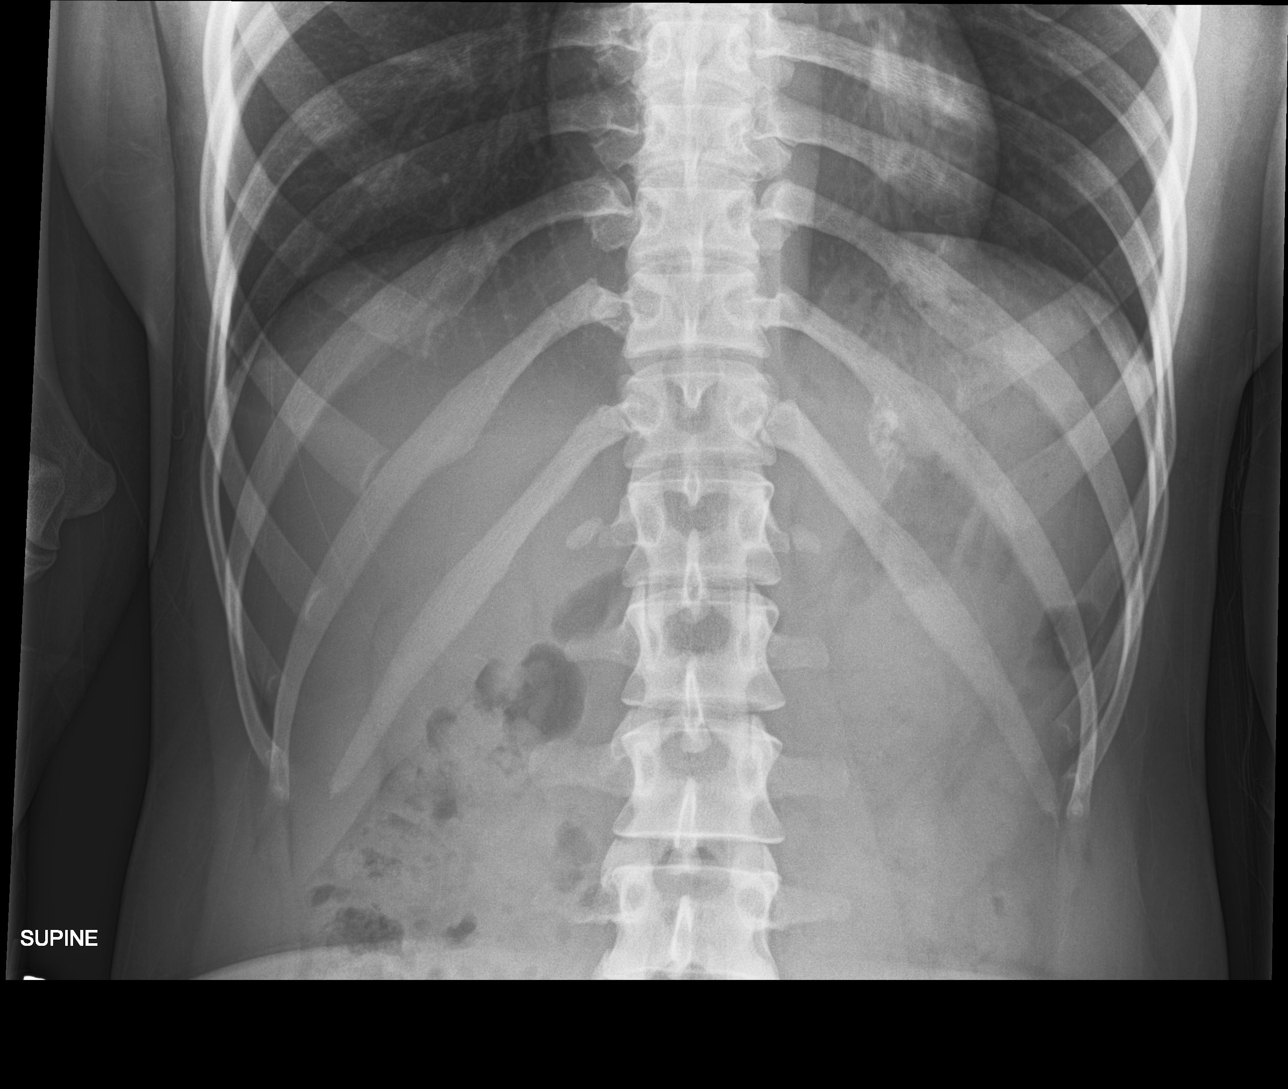

[abdomen erect (2 of 2)]
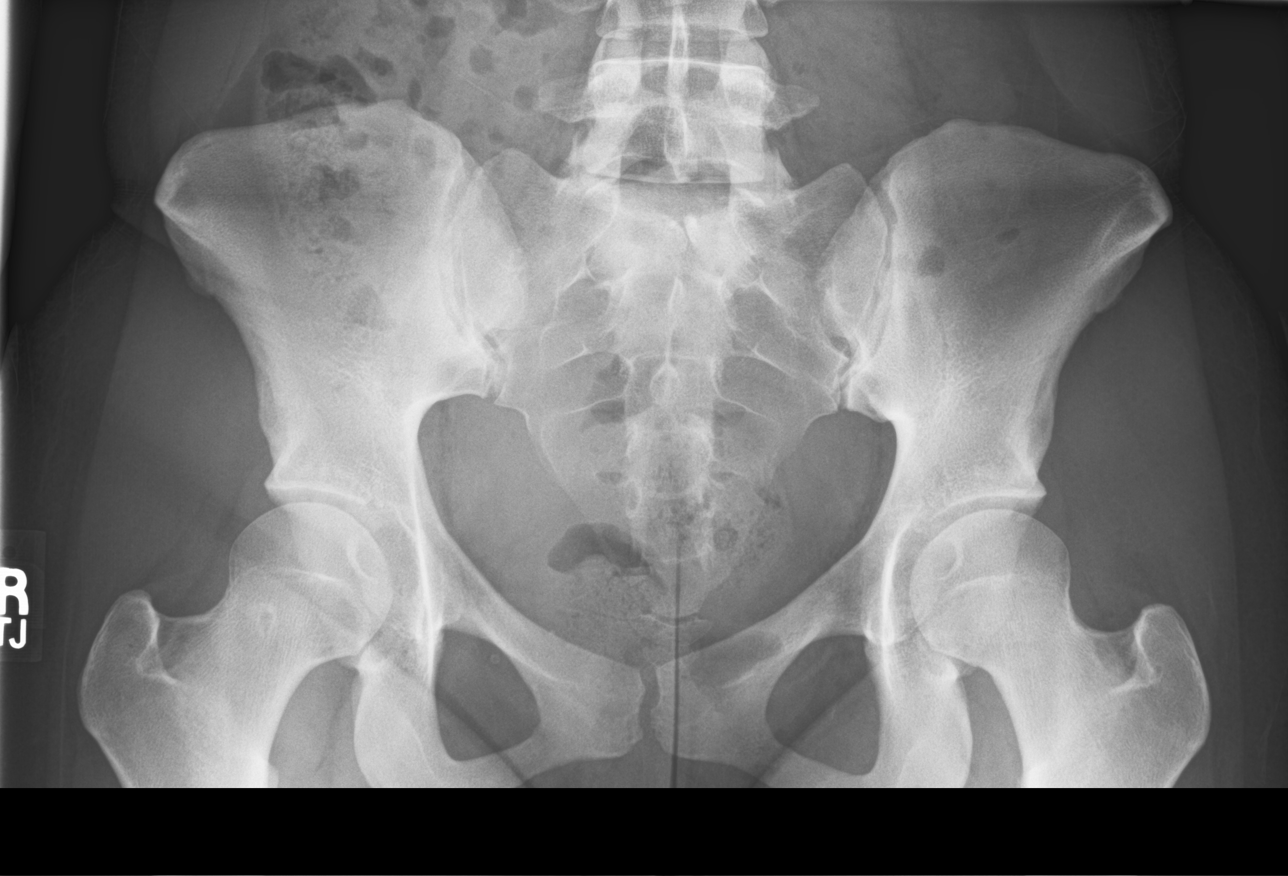

[4 of 4 positions shown; findings below may reference images not displayed]

FINDINGS: The bowel gas pattern is normal. There is no evidence of free air.
No radio-opaque calculi or other significant radiographic
abnormality is seen. Probable phleboliths in the left pelvis.
Moderate stool in the colon. Amorphous calcification overlying left
upper quadrant possibly costochondral.
IMPRESSION: Nonobstructed gas pattern with moderate stool

## 2022-10-13 DIAGNOSIS — F411 Generalized anxiety disorder: Secondary | ICD-10-CM | POA: Diagnosis not present

## 2022-10-15 ENCOUNTER — Encounter: Payer: Self-pay | Admitting: Family Medicine

## 2022-10-15 NOTE — Telephone Encounter (Signed)
Pt scheduled ov via mychart for 10/16/22

## 2022-10-15 NOTE — Telephone Encounter (Signed)
Please schedule an in clinic appt  Thanks

## 2022-10-16 ENCOUNTER — Encounter: Payer: Self-pay | Admitting: Family Medicine

## 2022-10-16 ENCOUNTER — Ambulatory Visit (INDEPENDENT_AMBULATORY_CARE_PROVIDER_SITE_OTHER): Payer: BC Managed Care – PPO | Admitting: Family Medicine

## 2022-10-16 VITALS — BP 130/60 | HR 125 | Temp 98.0°F | Ht 66.5 in | Wt 170.0 lb

## 2022-10-16 DIAGNOSIS — N920 Excessive and frequent menstruation with regular cycle: Secondary | ICD-10-CM | POA: Diagnosis not present

## 2022-10-16 DIAGNOSIS — D259 Leiomyoma of uterus, unspecified: Secondary | ICD-10-CM | POA: Diagnosis not present

## 2022-10-16 DIAGNOSIS — F430 Acute stress reaction: Secondary | ICD-10-CM

## 2022-10-16 MED ORDER — BUSPIRONE HCL 15 MG PO TABS: 15.0000 mg | ORAL_TABLET | Freq: Two times a day (BID) | ORAL | 3 refills | Status: DC

## 2022-10-16 NOTE — Progress Notes (Unsigned)
Subjective:    Patient ID: Christine Carr, female    DOB: 1995/10/29, 27 y.o.   MRN: [id number]  HPI Pt presents to discuss stress reaction with anxiety   Wt Readings from Last 3 Encounters:  10/16/22 170 lb (77.1 kg)  09/10/22 171 lb (77.6 kg)  07/21/22 172 lb 3.2 oz (78.1 kg)   27.03 kg/m  Vitals:   10/16/22 0759  BP: 130/60  Pulse: (!) 125  Temp: 98 F (36.7 C)  SpO2: 100%     Last visit we discussed work and family stressors and grief  Was going to a therapist every 2 weeks   We end her to continue counseling and self care  Px buspar 7.5 mg bid  It knocked the edge off anxiety a little bit A little sedating   Still a lot going on with her family   Job is incredibly stressful She cannot do it any more  Sits at her desk crying every day  Not sleeping well  Panicky -in the middle of the night   Is looking for a new job  Needs some time off to apply  Counselor is advising her   She works in [employer] , works from home  Is supposed to travel but her severe menstrual bleeding cannot right now     Seeing gyn for fibroids and heavy bleeding Has appt in early march     Self care  Not a lot  Spends most of her days job searching   24 hours per day with that and work   Not talking to anyone but counselor   No thoughts of self harm    Lab Results  Component Value Date   WBC 4.3 08/14/2021   HGB 10.5 (L) 08/14/2021   HCT 33.3 (L) 08/14/2021   MCV 80.5 08/14/2021   PLT 471.0 (H) 08/14/2021   Patient Active Problem List   Diagnosis Date Noted   Tachycardia 07/09/2021   Iron deficiency 12/02/2020   Uterine fibroid 09/02/2020   Stress reaction 09/02/2020   Heavy menses 09/02/2020   Dry mouth 03/13/2019   Encounter for screening mammogram for breast cancer 08/01/2018   Fibrocystic breast changes of both breasts 11/21/2013   Past Medical History:  Diagnosis Date   Iron deficiency anemia due to chronic blood loss    Lump or mass in  breast 2013   right    Pap smear of cervix with ASCUS, cannot exclude HGSIL    History reviewed. No pertinent surgical history. Social History   Tobacco Use   Smoking status: Never   Smokeless tobacco: Never  Substance Use Topics   Alcohol use: Yes    Alcohol/week: 0.0 standard drinks of alcohol    Comment: socially   Drug use: No   Family History  Problem Relation Age of Onset   Lung cancer Maternal Aunt    CAD Maternal Uncle    No Known Allergies Current Outpatient Medications on File Prior to Visit  Medication Sig Dispense Refill   ferrous sulfate 220 (44 Fe) MG/5ML solution TAKE 5 MLS (220 MG TOTAL) BY MOUTH DAILY. 450 mL 0   tretinoin (RETIN-A) 0.1 % cream Apply 1 application topically every other day.     No current facility-administered medications on file prior to visit.      Review of Systems  Constitutional:  Positive for activity change, appetite change and fatigue. Negative for unexpected weight change.  Cardiovascular:  Negative for chest pain.  Gastrointestinal:  Negative for  abdominal pain.  Genitourinary:  Positive for menstrual problem.  Psychiatric/Behavioral:  Positive for decreased concentration, dysphoric mood and sleep disturbance. Negative for agitation, behavioral problems, confusion, self-injury and suicidal ideas. The patient is nervous/anxious.        Objective:   Physical Exam Constitutional:      General: She is not in acute distress.    Appearance: Normal appearance. She is well-developed and normal weight. She is not ill-appearing or diaphoretic.  HENT:     Head: Normocephalic and atraumatic.  Eyes:     General: No scleral icterus.    Conjunctiva/sclera: Conjunctivae normal.     Pupils: Pupils are equal, round, and reactive to light.  Neck:     Thyroid: No thyromegaly.     Vascular: No carotid bruit or JVD.  Cardiovascular:     Rate and Rhythm: Regular rhythm. Tachycardia present.     Heart sounds: Normal heart sounds. No murmur  heard.    No gallop.  Pulmonary:     Effort: Pulmonary effort is normal. No respiratory distress.     Breath sounds: Normal breath sounds. No wheezing or rales.  Abdominal:     General: There is no distension or abdominal bruit.     Palpations: Abdomen is soft.     Tenderness: There is no abdominal tenderness.     Comments: Suprapubic fullness consistent with fibroids noted  Musculoskeletal:     Cervical back: Normal range of motion and neck supple.     Right lower leg: No edema.     Left lower leg: No edema.  Lymphadenopathy:     Cervical: No cervical adenopathy.  Skin:    General: Skin is warm and dry.     Coloration: Skin is not pale.     Findings: No rash.  Neurological:     Mental Status: She is alert.     Coordination: Coordination normal.     Deep Tendon Reflexes: Reflexes are normal and symmetric. Reflexes normal.  Psychiatric:        Attention and Perception: Attention normal.        Mood and Affect: Mood is anxious and depressed. Affect is tearful.        Behavior: Behavior normal.        Thought Content: Thought content is not delusional. Thought content does not include suicidal ideation.        Cognition and Memory: Cognition and memory normal.     Comments: Candidly discusses symptoms and stressors             Assessment & Plan:   Problem List Items Addressed This Visit       Genitourinary   Uterine fibroid    Very heavy menses and anemia  Enc her to get back on iron  Has gyn appt in march  Unable to travel due to heavy bleeding        Other   Heavy menses    With fibroid Has gyn appt next mo to disc tx   Unable to travel due to heavy menses  Enc her to get back on her iron supplement (hope she will tolerate the liquid iron better than pills)      Stress reaction - Primary    More severe recently with anxious symptoms  Job is the source of most stress- feels overwhelmed and unable to keep up , now having trouble functioning due to severe  anx symptoms Some family issues and health problem (heavy bleeding) also add to stress  Cannot change the situation, thinks job is a poor fit for her  Reviewed stressors/ coping techniques/symptoms/ support sources/ tx options and side effects in detail today  Plan to inc buspar to 15 mg bid as tolerated  Disc imp of self care Evident she needs to leave this job and look for another  Note written to be out of work 2 wk, to give her time to work on 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 notice, job Secretary/administrator and continue counseling  No SI or self harm today  30 minutes were spent today both face to face and in the chart obtaining history, reviewing records and test results,(anemia)   performing exam , educating and discussing treatment options, and placing orders  For buspar Discussed expectations of this medication including time to effectiveness and mechanism of action, also poss of side effects (early and late)- including mental fuzziness, weight or appetite change, nausea and poss of worse dep or anxiety (even suicidal thoughts)  Pt voiced understanding and will stop med and update if this occurs              Relevant Medications   busPIRone (BUSPAR) 15 MG tablet

## 2022-10-16 NOTE — Patient Instructions (Addendum)
I wrote for 2 weeks out of work for medical condition  Take care of yourself   Start the process of putting in to leave your job  Keep job searching  Give yourself a moment to rest also  Keep talking to your counselor   Go up on buspar to 15 mg twice daily   Follow up here in 1-2 weeks    Start on the liquid iron immediately

## 2022-10-18 NOTE — Assessment & Plan Note (Signed)
With fibroid Has gyn appt next mo to disc tx   Unable to travel due to heavy menses  Enc her to get back on her iron supplement (hope she will tolerate the liquid iron better than pills)

## 2022-10-18 NOTE — Assessment & Plan Note (Signed)
Very heavy menses and anemia  Enc her to get back on iron  Has gyn appt in march  Unable to travel due to heavy bleeding

## 2022-10-18 NOTE — Assessment & Plan Note (Addendum)
More severe recently with anxious symptoms  Job is the source of most stress- feels overwhelmed and unable to keep up , now having trouble functioning due to severe anx symptoms Some family issues and health problem (heavy bleeding) also add to stress Cannot change the situation, thinks job is a poor fit for her  Reviewed stressors/ coping techniques/symptoms/ support sources/ tx options and side effects in detail today  Plan to inc buspar to 15 mg bid as tolerated  Disc imp of self care Evident she needs to leave this job and look for another  Note written to be out of work 2 wk, to give her time to work on 2 wk notice, job Secretary/administrator and continue counseling  No SI or self harm today  30 minutes were spent today both face to face and in the chart obtaining history, reviewing records and test results,(anemia)   performing exam , educating and discussing treatment options, and placing orders  For buspar Discussed expectations of this medication including time to effectiveness and mechanism of action, also poss of side effects (early and late)- including mental fuzziness, weight or appetite change, nausea and poss of worse dep or anxiety (even suicidal thoughts)  Pt voiced understanding and will stop med and update if this occurs

## 2022-10-26 ENCOUNTER — Encounter: Payer: Self-pay | Admitting: Family Medicine

## 2022-10-26 ENCOUNTER — Ambulatory Visit (INDEPENDENT_AMBULATORY_CARE_PROVIDER_SITE_OTHER): Payer: BC Managed Care – PPO | Admitting: Family Medicine

## 2022-10-26 VITALS — BP 122/72 | HR 104 | Temp 98.2°F | Ht 66.5 in | Wt 171.0 lb

## 2022-10-26 DIAGNOSIS — F430 Acute stress reaction: Secondary | ICD-10-CM | POA: Diagnosis not present

## 2022-10-26 DIAGNOSIS — J101 Influenza due to other identified influenza virus with other respiratory manifestations: Secondary | ICD-10-CM | POA: Diagnosis not present

## 2022-10-26 DIAGNOSIS — R051 Acute cough: Secondary | ICD-10-CM | POA: Diagnosis not present

## 2022-10-26 LAB — POCT INFLUENZA A/B
Influenza A, POC: POSITIVE — AB
Influenza B, POC: NEGATIVE

## 2022-10-26 MED ORDER — BENZONATATE 200 MG PO CAPS: 200.0000 mg | ORAL_CAPSULE | Freq: Three times a day (TID) | ORAL | 1 refills | Status: DC | PRN

## 2022-10-26 NOTE — Assessment & Plan Note (Signed)
Doing better off work Will put in her notice on Monday and has interview for new job this Friday   PHQ /GAD score much improved Will continue buspar 15 mg bid   Enc self care

## 2022-10-26 NOTE — Progress Notes (Signed)
Subjective:    Patient ID: Christine Carr, female    DOB: 1996/06/27, 27 y.o.   MRN: [id number]  HPI Pt presents for uri symptoms  Also f/u for mood    Wt Readings from Last 3 Encounters:  10/26/22 171 lb (77.6 kg)  10/16/22 170 lb (77.1 kg)  09/10/22 171 lb (77.6 kg)   27.19 kg/m  Vitals:   10/26/22 0951  BP: 122/72  Pulse: (!) 104  Temp: 98.2 F (36.8 C)  SpO2: 99%    Flu A test is pos today  Home covid tests neg   Started symptoms thurs nt- a cough Friday woke up with ST   Bad cough- phlegm/ green to clear/ not a lot  Sneezing Congestion  A little nausea -could be from period also   No wheeze No sob  Chest burns with cough  No body aches  Thermometer at home 101   Otc  Mucinex  Ibuprofen   Fluids   Results for orders placed or performed in visit on 10/26/22  POCT Influenza A/B  Result Value Ref Range   Influenza A, POC Positive (A) Negative   Influenza B, POC Negative Negative     Mood Overall better  Job interview Friday   Buspar working better at 15 mg twice dialy   Not sleeping well but has the flu   Patient Active Problem List   Diagnosis Date Noted   Influenza A 10/26/2022   Tachycardia 07/09/2021   Iron deficiency 12/02/2020   Uterine fibroid 09/02/2020   Stress reaction 09/02/2020   Heavy menses 09/02/2020   Dry mouth 03/13/2019   Encounter for screening mammogram for breast cancer 08/01/2018   Fibrocystic breast changes of both breasts 11/21/2013   Past Medical History:  Diagnosis Date   Iron deficiency anemia due to chronic blood loss    Lump or mass in breast 2013   right    Pap smear of cervix with ASCUS, cannot exclude HGSIL    History reviewed. No pertinent surgical history. Social History   Tobacco Use   Smoking status: Never   Smokeless tobacco: Never  Substance Use Topics   Alcohol use: Yes    Alcohol/week: 0.0 standard drinks of alcohol    Comment: socially   Drug use: No   Family History  Problem  Relation Age of Onset   Lung cancer Maternal Aunt    CAD Maternal Uncle    No Known Allergies Current Outpatient Medications on File Prior to Visit  Medication Sig Dispense Refill   busPIRone (BUSPAR) 15 MG tablet Take 1 tablet (15 mg total) by mouth 2 (two) times daily. 60 tablet 3   ferrous sulfate 220 (44 Fe) MG/5ML solution TAKE 5 MLS (220 MG TOTAL) BY MOUTH DAILY. 450 mL 0   tretinoin (RETIN-A) 0.1 % cream Apply 1 application topically every other day.     No current facility-administered medications on file prior to visit.    Review of Systems  Constitutional:  Positive for fatigue and fever.  HENT:  Positive for congestion, postnasal drip, rhinorrhea, sinus pressure, sneezing and sore throat. Negative for ear pain and voice change.   Eyes:  Negative for pain and discharge.  Respiratory:  Positive for cough. Negative for shortness of breath, wheezing and stridor.   Cardiovascular:  Negative for chest pain.  Gastrointestinal:  Negative for diarrhea, nausea and vomiting.  Genitourinary:  Negative for frequency, hematuria and urgency.  Musculoskeletal:  Negative for arthralgias and myalgias.  Skin:  Negative  for rash.  Neurological:  Positive for headaches. Negative for dizziness, weakness and light-headedness.  Psychiatric/Behavioral:  Negative for confusion and dysphoric mood.        Objective:   Physical Exam Constitutional:      General: She is not in acute distress.    Appearance: Normal appearance. She is well-developed and normal weight. She is not ill-appearing, toxic-appearing or diaphoretic.  HENT:     Head: Normocephalic and atraumatic.     Comments: Nares are injected and congested      Right Ear: Tympanic membrane, ear canal and external ear normal.     Left Ear: Tympanic membrane, ear canal and external ear normal.     Nose: Congestion and rhinorrhea present.     Comments: Frequent sneezing    Mouth/Throat:     Mouth: Mucous membranes are moist.     Pharynx:  Oropharynx is clear. No oropharyngeal exudate or posterior oropharyngeal erythema.     Comments: Clear pnd  Eyes:     General:        Right eye: No discharge.        Left eye: No discharge.     Conjunctiva/sclera: Conjunctivae normal.     Pupils: Pupils are equal, round, and reactive to light.  Cardiovascular:     Rate and Rhythm: Tachycardia present.     Heart sounds: Normal heart sounds.  Pulmonary:     Effort: Pulmonary effort is normal. No respiratory distress.     Breath sounds: Normal breath sounds. No stridor. No wheezing, rhonchi or rales.     Comments: Good air exch Occ dry sounding cough  Chest:     Chest wall: No tenderness.  Musculoskeletal:     Cervical back: Normal range of motion and neck supple.  Lymphadenopathy:     Cervical: No cervical adenopathy.  Skin:    General: Skin is warm and dry.     Capillary Refill: Capillary refill takes less than 2 seconds.     Findings: No rash.  Neurological:     Mental Status: She is alert.     Cranial Nerves: No cranial nerve deficit.  Psychiatric:        Attention and Perception: Attention normal.        Mood and Affect: Mood normal. Mood is not anxious or depressed. Affect is not tearful.        Speech: Speech normal.        Behavior: Behavior normal.        Cognition and Memory: Cognition and memory normal.     Comments: Improved mood Less anxious Not tearful Candidly discusses symptoms and stressors             Assessment & Plan:   Problem List Items Addressed This Visit       Respiratory   Influenza A - Primary    Day 4 -out of window for anti viral  Overall mild  Reassuring exam Discussed symptom care-see AVS Send in tessalon for cough  Disc ER precautions Update if not starting to improve in a week or if worsening    Handout given         Other   Stress reaction    Doing better off work Will put in her notice on Monday and has interview for new job this Friday   PHQ /GAD score much  improved Will continue buspar 15 mg bid   Enc self care       Other Visit Diagnoses  Acute cough       Relevant Orders   POCT Influenza A/B (Completed)

## 2022-10-26 NOTE — Assessment & Plan Note (Signed)
Day 4 -out of window for anti viral  Overall mild  Reassuring exam Discussed symptom care-see AVS Send in tessalon for cough  Disc ER precautions Update if not starting to improve in a week or if worsening    Handout given

## 2022-10-26 NOTE — Patient Instructions (Addendum)
Drink fluids and rest  mucinex DM is good for cough and congestion  Try the tessalon for cough  Nasal saline for congestion as needed  Tylenol or ibuprofen  for fever or pain or headache  Please alert [place] if symptoms worsen (if severe or short of breath please go to the ER)   Update if not starting to improve in a week or if worsening

## 2022-10-29 ENCOUNTER — Telehealth: Payer: Self-pay

## 2022-10-29 NOTE — Telephone Encounter (Signed)
We received an FMLA form for patient today.  This is concerning her appts on 10/16/22 and 10/26/22.  Continuous leave from 10/16/22-11/02/22.  Form has been placed in your inbox for completion and signing.  Due date 11/15/2022.  When form is complete, patient will come in to pick up.

## 2022-11-03 ENCOUNTER — Encounter: Payer: Self-pay | Admitting: Family Medicine

## 2022-11-03 ENCOUNTER — Telehealth: Payer: Self-pay

## 2022-11-03 NOTE — Telephone Encounter (Signed)
Needs to come in to work on both this and fmla  Sorry-more complex than I thought

## 2022-11-03 NOTE — Telephone Encounter (Signed)
Reviewed paperwork, and need to have her back in to work on both forms  Thanks

## 2022-11-03 NOTE — Telephone Encounter (Signed)
We received a Fitness for Duty form from patient.  This has been placed in your inbox for completion and signing.  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 I'm so sorry to bother you but is there anyway that you could fill out this fitness for duty form please. I did return to work on 2/26. Thank you so much!

## 2022-11-04 NOTE — Telephone Encounter (Signed)
Will also send message to schedulers to reach out to pt to get an appt with PCP incase pt doesn't see mychart message

## 2022-11-04 NOTE — Telephone Encounter (Signed)
Sent patient mychart msg about needing to come in for office visit with Dr Glori Bickers for [employer] and [employer] for [employer].

## 2022-11-04 NOTE — Telephone Encounter (Signed)
lvmtcb

## 2022-11-05 NOTE — Telephone Encounter (Signed)
Patient has an appt with you on 11/11/22 to discuss paperwork.

## 2022-11-06 NOTE — Telephone Encounter (Signed)
Kenita from [facility] called requesting a call back from nurse regarding the pt's ADA request. Call back # [phone] (direct line, can leave vm)

## 2022-11-06 NOTE — Telephone Encounter (Signed)
I called and spoke to Devon at [facility].  She is also sending over an ADA form for you to fill out.  She will be faxing this by end of business day today.  I notified her that you are seeing the patient in office on 11/11/22 and will be able to fill out paperwork after that appt.

## 2022-11-10 ENCOUNTER — Other Ambulatory Visit: Payer: Self-pay | Admitting: Family Medicine

## 2022-11-11 ENCOUNTER — Encounter: Payer: Self-pay | Admitting: Family Medicine

## 2022-11-11 ENCOUNTER — Ambulatory Visit (INDEPENDENT_AMBULATORY_CARE_PROVIDER_SITE_OTHER): Payer: BC Managed Care – PPO | Admitting: Family Medicine

## 2022-11-11 VITALS — BP 102/70 | HR 106 | Temp 98.3°F | Ht 66.5 in | Wt 169.0 lb

## 2022-11-11 DIAGNOSIS — F430 Acute stress reaction: Secondary | ICD-10-CM | POA: Diagnosis not present

## 2022-11-11 DIAGNOSIS — D259 Leiomyoma of uterus, unspecified: Secondary | ICD-10-CM | POA: Diagnosis not present

## 2022-11-11 NOTE — Assessment & Plan Note (Signed)
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

## 2022-11-11 NOTE — Progress Notes (Signed)
Subjective:    Patient ID: Christine Carr, female    DOB: [birth year], 27 y.o.   MRN: [id number]  HPI Pt presents for f/u of mood and return to work  [facility] Readings from Last 3 Encounters:  11/11/22 169 lb (76.7 kg)  10/26/22 171 lb (77.6 kg)  10/16/22 170 lb (77.1 kg)   26.87 kg/m  Vitals:   11/11/22 0834  BP: 102/70  Pulse: (!) 106  Temp: 98.3 F (36.8 C)  SpO2: 99%   Went back to work on 2/26  Planned to put notice in that day to leave  Spoke with manager about accomodation  Proposed changing role to team support  Title change  Did not change enough   Put in her 2 week notice yesterday -- last day will be 3/19 (most likely)   Anxiety is up again  Knows she needs to go   Also needs surgery- will meet with gyn tomorrow   Seeing a therapist  Has had to cancel for work  Needs FMLA for her appts  Every 2 weeks   Had one interview Did not get the job   Patient Active Problem List   Diagnosis Date Noted   Influenza A 10/26/2022   Tachycardia 07/09/2021   Iron deficiency 12/02/2020   Uterine fibroid 09/02/2020   Stress reaction 09/02/2020   Heavy menses 09/02/2020   Dry mouth 03/13/2019   Encounter for screening mammogram for breast cancer 08/01/2018   Fibrocystic breast changes of both breasts 11/21/2013   Past Medical History:  Diagnosis Date   Iron deficiency anemia due to chronic blood loss    Lump or mass in breast 2013   right    Pap smear of cervix with ASCUS, cannot exclude HGSIL    History reviewed. No pertinent surgical history. Social History   Tobacco Use   Smoking status: Never   Smokeless tobacco: Never  Substance Use Topics   Alcohol use: Yes    Alcohol/week: 0.0 standard drinks of alcohol    Comment: socially   Drug use: No   Family History  Problem Relation Age of Onset   Lung cancer Maternal Aunt    CAD Maternal Uncle    No Known Allergies Current Outpatient Medications on File Prior to Visit  Medication Sig Dispense Refill    busPIRone (BUSPAR) 15 MG tablet Take 1 tablet (15 mg total) by mouth 2 (two) times daily. 60 tablet 3   ferrous sulfate 220 (44 Fe) MG/5ML solution TAKE 5 MLS (220 MG TOTAL) BY MOUTH DAILY. 450 mL 0   tretinoin (RETIN-A) 0.1 % cream Apply 1 application topically every other day.     No current facility-administered medications on file prior to visit.     Review of Systems  Constitutional:  Positive for fatigue. Negative for activity change, appetite change, fever and unexpected weight change.  HENT:  Negative for congestion, ear pain, rhinorrhea, sinus pressure and sore throat.   Eyes:  Negative for pain, redness and visual disturbance.  Respiratory:  Negative for cough, shortness of breath and wheezing.   Cardiovascular:  Negative for chest pain and palpitations.  Gastrointestinal:  Negative for abdominal pain, blood in stool, constipation and diarrhea.  Endocrine: Negative for polydipsia and polyuria.  Genitourinary:  Positive for menstrual problem, pelvic pain and vaginal bleeding. Negative for dysuria, frequency and urgency.  Musculoskeletal:  Negative for arthralgias, back pain and myalgias.  Skin:  Negative for pallor and rash.  Allergic/Immunologic: Negative for environmental allergies.  Neurological:  Negative for dizziness, syncope and headaches.  Hematological:  Negative for adenopathy. Does not bruise/bleed easily.  Psychiatric/Behavioral:  Positive for dysphoric mood and sleep disturbance. Negative for agitation, confusion, decreased concentration, self-injury and suicidal ideas. The patient is nervous/anxious.        Objective:   Physical Exam Constitutional:      General: She is not in acute distress.    Appearance: Normal appearance. She is well-developed and normal weight. She is not ill-appearing or diaphoretic.  HENT:     Head: Normocephalic and atraumatic.  Eyes:     Conjunctiva/sclera: Conjunctivae normal.     Pupils: Pupils are equal, round, and reactive to  light.  Neck:     Thyroid: No thyromegaly.     Vascular: No carotid bruit or JVD.  Cardiovascular:     Rate and Rhythm: Normal rate and regular rhythm.     Heart sounds: Normal heart sounds.     No gallop.  Pulmonary:     Effort: Pulmonary effort is normal. No respiratory distress.  Abdominal:     General: There is no abdominal bruit.  Musculoskeletal:     Cervical back: Normal range of motion and neck supple.     Right lower leg: No edema.     Left lower leg: No edema.  Lymphadenopathy:     Cervical: No cervical adenopathy.  Skin:    General: Skin is warm and dry.     Coloration: Skin is not pale.     Findings: No rash.  Neurological:     Mental Status: She is alert.     Cranial Nerves: No cranial nerve deficit.     Motor: No weakness.     Coordination: Coordination normal.     Deep Tendon Reflexes: Reflexes are normal and symmetric. Reflexes normal.     Comments: No tremor   Psychiatric:        Attention and Perception: Attention normal.        Mood and Affect: Mood is anxious. Affect is not tearful.        Speech: Speech normal.        Thought Content: Thought content normal.        Cognition and Memory: Cognition and memory normal.     Comments: Candidly discusses symptoms and stressors   Not tearful today           Assessment & Plan:   Problem List Items Addressed This Visit       Genitourinary   Uterine fibroid    Heavy menses and anemia Likely needs surgical treatment   Has f/u with new gyn tomorrow  Continues iron         Other   Stress reaction - Primary    With anxious symptoms/ likely GAD Still in response to stressful job She returned on 2/26 w/o restrictions  Needs FMLA to attend medical and counseling visits for anxiety twice a month   Reviewed stressors/ coping techniques/symptoms/ support sources/ tx options and side effects in detail today Buspar 15 mg bid  Is worse since going back to work  [employer] strongly to look for new job (she  did put notice in)  Enc strongly to continue mental health counseling   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

## 2022-11-11 NOTE — Assessment & Plan Note (Signed)
Heavy menses and anemia Likely needs surgical treatment   Has f/u with new gyn tomorrow  Continues iron

## 2022-11-11 NOTE — Patient Instructions (Signed)
Keep looking for new work   I did your paperwork to return to work and for a day off every 2 weeks for treatment   Continue therapy and medication   See gyn as planned

## 2022-11-12 DIAGNOSIS — N920 Excessive and frequent menstruation with regular cycle: Secondary | ICD-10-CM | POA: Diagnosis not present

## 2022-11-12 DIAGNOSIS — D219 Benign neoplasm of connective and other soft tissue, unspecified: Secondary | ICD-10-CM | POA: Diagnosis not present

## 2022-11-12 NOTE — Telephone Encounter (Signed)
Fitness for Duty form completed and faxed to fax# [phone].  Received fax confirmation.  Copies made for scanning, patient, and myself.  Sent mychart msg that this is ready for pickup.

## 2022-11-12 NOTE — Telephone Encounter (Signed)
Completed form has been faxed to fax# [phone].  Received fax confirmation.  Copies made for scanning, patient, and myself.  Sent mychart msg to patient to notify her this is complete and that her copy is ready for pickup.

## 2022-11-17 ENCOUNTER — Telehealth: Payer: Self-pay | Admitting: *Deleted

## 2022-11-17 ENCOUNTER — Ambulatory Visit: Payer: BC Managed Care – PPO | Admitting: Family Medicine

## 2022-11-17 NOTE — Telephone Encounter (Signed)
Received more FMLA forms from pt's employer and per Dr. Glori Bickers pt needs an appt to discuss this, please schedule pt an appt with PCP to review forms. thanks

## 2022-11-17 NOTE — Telephone Encounter (Signed)
Lvm for patient tcb and schedule 

## 2022-11-18 DIAGNOSIS — D219 Benign neoplasm of connective and other soft tissue, unspecified: Secondary | ICD-10-CM | POA: Diagnosis not present

## 2022-11-20 ENCOUNTER — Telehealth: Payer: BC Managed Care – PPO | Admitting: Family Medicine

## 2022-11-24 DIAGNOSIS — F411 Generalized anxiety disorder: Secondary | ICD-10-CM | POA: Diagnosis not present

## 2022-11-29 ENCOUNTER — Other Ambulatory Visit: Payer: Self-pay | Admitting: Family Medicine

## 2022-12-18 ENCOUNTER — Ambulatory Visit: Payer: BC Managed Care – PPO | Admitting: Family Medicine

## 2023-06-21 ENCOUNTER — Encounter: Payer: Self-pay | Admitting: Internal Medicine

## 2023-06-24 ENCOUNTER — Encounter: Payer: Self-pay | Admitting: Family Medicine

## 2023-06-24 ENCOUNTER — Ambulatory Visit: Payer: BC Managed Care – PPO | Admitting: Family Medicine

## 2023-06-24 VITALS — BP 128/64 | HR 91 | Temp 99.4°F | Ht 66.5 in | Wt 172.0 lb

## 2023-06-24 DIAGNOSIS — H6123 Impacted cerumen, bilateral: Secondary | ICD-10-CM

## 2023-06-24 DIAGNOSIS — E611 Iron deficiency: Secondary | ICD-10-CM

## 2023-06-24 DIAGNOSIS — H6120 Impacted cerumen, unspecified ear: Secondary | ICD-10-CM | POA: Insufficient documentation

## 2023-06-24 DIAGNOSIS — R509 Fever, unspecified: Secondary | ICD-10-CM | POA: Insufficient documentation

## 2023-06-24 MED ORDER — FERROUS SULFATE 300 MG/6.8ML PO SOLN: 5.0000 mL | Freq: Every day | ORAL | 0 refills | Status: DC

## 2023-06-24 NOTE — Assessment & Plan Note (Signed)
Bilateral Worse on right  Most symptoms are on Left   Simple ear irritation today  Worked best on left ear and sense of pressure resolved Right ear-some remains Instructed to start using debrox or similar as directed three times weekly   Follow up about 2 wk for repeat irrigation of right

## 2023-06-24 NOTE — Assessment & Plan Note (Signed)
Temp is 99.4 on 2nd check Feels fine  ? If this was from hot car or hormone change/ovulation ? Instructed to watch for signs and symptoms of infection  Keep check at home

## 2023-06-24 NOTE — Progress Notes (Signed)
Subjective:    Patient ID: Christine Carr, female    DOB: September 11, 1995, 27 y.o.   MRN: [id number]  HPI  Wt Readings from Last 3 Encounters:  06/24/23 172 lb (78 kg)  11/11/22 169 lb (76.7 kg)  10/26/22 171 lb (77.6 kg)   27.35 kg/m  Vitals:   06/24/23 1021 06/24/23 1042  BP: 128/64   Pulse: (!) 101 91  Temp: 100 F (37.8 C) 99.4 F (37.4 C)  SpO2: 100%     Needs refill of liquid iron  Pt presents for ear pain and pressure Temp is up today on first check   Lab Results  Component Value Date   WBC 4.3 08/14/2021   HGB 10.5 (L) 08/14/2021   HCT 33.3 (L) 08/14/2021   MCV 80.5 08/14/2021   PLT 471.0 (H) 08/14/2021   Lab Results  Component Value Date   IRON 22 (L) 08/14/2021   TIBC 468 (H) 12/02/2020   FERRITIN 5 (L) 12/02/2020   Periods are still heavy  Sees gyn  May plan a procedure  Transemic acid      Last week had random pressure/pain in left ear  No cough  No congestion or runny nose No ST  Does not feel feverish at all   No history of wax issues  Hearing is fine   Does wear in ear head phones a lot    Patient Active Problem List   Diagnosis Date Noted   Cerumen impaction 06/24/2023   Elevated temperature 06/24/2023   Tachycardia 07/09/2021   Iron deficiency 12/02/2020   Uterine fibroid 09/02/2020   Stress reaction 09/02/2020   Heavy menses 09/02/2020   Dry mouth 03/13/2019   Encounter for screening mammogram for breast cancer 08/01/2018   Fibrocystic breast changes of both breasts 11/21/2013   Past Medical History:  Diagnosis Date   Iron deficiency anemia due to chronic blood loss    Lump or mass in breast 2013   right    Pap smear of cervix with ASCUS, cannot exclude HGSIL    History reviewed. No pertinent surgical history. Social History   Tobacco Use   Smoking status: Never   Smokeless tobacco: Never  Substance Use Topics   Alcohol use: Yes    Alcohol/week: 0.0 standard drinks of alcohol    Comment: socially   Drug use:  No   Family History  Problem Relation Age of Onset   Lung cancer Maternal Aunt    CAD Maternal Uncle    No Known Allergies Current Outpatient Medications on File Prior to Visit  Medication Sig Dispense Refill   tretinoin (RETIN-A) 0.1 % cream Apply 1 application topically every other day.     No current facility-administered medications on file prior to visit.    Review of Systems  Constitutional:  Negative for fatigue.  HENT:  Positive for ear pain. Negative for congestion, dental problem, ear discharge, facial swelling, hearing loss, postnasal drip, rhinorrhea, sinus pressure, sinus pain, sneezing, sore throat and tinnitus.   Respiratory:  Negative for cough.   Neurological:  Negative for headaches.       Objective:   Physical Exam Constitutional:      General: She is not in acute distress.    Appearance: Normal appearance. She is normal weight. She is not ill-appearing or diaphoretic.  HENT:     Head: Normocephalic and atraumatic.     Right Ear: There is impacted cerumen.     Left Ear: There is impacted cerumen.  Ears:     Comments: Right ear- total cerumen impaction (wet appearing) Left ear -partial cerumen impaction / partially visible TM that looks normal  No external tenderness    Procedure: Cerumen Disimpaction After consent obtained   Warm water was applied and gentle ear lavage performed on both ears     There were no complications and following the disimpaction the tympanic membrane were visible on the left side  Tympanic membranes are intact following the procedure.  Auditory canals are normal.  The patient reported relief of symptoms after removal of cerumen.      Nose: Nose normal.     Mouth/Throat:     Mouth: Mucous membranes are moist.     Pharynx: Oropharynx is clear. No posterior oropharyngeal erythema.  Eyes:     General:        Right eye: No discharge.        Left eye: No discharge.     Conjunctiva/sclera: Conjunctivae normal.     Pupils:  Pupils are equal, round, and reactive to light.  Cardiovascular:     Rate and Rhythm: Tachycardia present.  Pulmonary:     Effort: Pulmonary effort is normal. No respiratory distress.  Musculoskeletal:     Cervical back: Neck supple.  Lymphadenopathy:     Cervical: No cervical adenopathy.  Skin:    General: Skin is warm and dry.     Findings: No erythema or rash.  Neurological:     Mental Status: She is alert.     Cranial Nerves: No cranial nerve deficit.  Psychiatric:        Mood and Affect: Mood normal.           Assessment & Plan:   Problem List Items Addressed This Visit       Nervous and Auditory   Cerumen impaction - Primary    Bilateral Worse on right  Most symptoms are on Left   Simple ear irritation today  Worked best on left ear and sense of pressure resolved Right ear-some remains Instructed to start using debrox or similar as directed three times weekly   Follow up about 2 wk for repeat irrigation of right         Other   Elevated temperature    Temp is 99.4 on 2nd check Feels fine  ? If this was from hot car or hormone change/ovulation ? Instructed to watch for signs and symptoms of infection  Keep check at home        Iron deficiency    Pt lost last supply of liquid iron  Refilled today  Will get back on it   Re check lab at 2 wk follow up   She is working on plan with gyn for procedure to help her heavy menses

## 2023-06-24 NOTE — Assessment & Plan Note (Signed)
Pt lost last supply of liquid iron  Refilled today  Will get back on it   Re check lab at 2 wk follow up   She is working on plan with gyn for procedure to help her heavy menses

## 2023-06-24 NOTE — Patient Instructions (Addendum)
We flushed both ears  Left is clear - if pain returns or persists let us know   There is still wax in right ear Get debrox (or similar product) over the counter and use it 3 times weekly as directed to help dissolve and loosen the wax   Follow up here in 2 weeks and we can flush it again    Watch your temp    Get flu shot when temp is normal   Get back on iron  If any trouble with that let me know  We can re check your iron and cbc at follow up also

## 2023-06-28 ENCOUNTER — Ambulatory Visit: Payer: BC Managed Care – PPO | Admitting: Family Medicine

## 2023-07-21 ENCOUNTER — Other Ambulatory Visit: Payer: BC Managed Care – PPO

## 2023-07-27 ENCOUNTER — Encounter: Payer: BC Managed Care – PPO | Admitting: Family Medicine

## 2023-07-28 ENCOUNTER — Encounter: Payer: BC Managed Care – PPO | Admitting: Family Medicine

## 2023-08-23 ENCOUNTER — Ambulatory Visit: Payer: BC Managed Care – PPO | Admitting: Family Medicine

## 2023-08-24 ENCOUNTER — Encounter: Payer: Self-pay | Admitting: Family Medicine

## 2023-09-06 ENCOUNTER — Encounter: Payer: Self-pay | Admitting: Internal Medicine

## 2023-12-30 DIAGNOSIS — F411 Generalized anxiety disorder: Secondary | ICD-10-CM | POA: Diagnosis not present

## 2024-01-13 DIAGNOSIS — F411 Generalized anxiety disorder: Secondary | ICD-10-CM | POA: Diagnosis not present

## 2024-02-14 ENCOUNTER — Ambulatory Visit (INDEPENDENT_AMBULATORY_CARE_PROVIDER_SITE_OTHER): Admitting: Family Medicine

## 2024-02-14 ENCOUNTER — Encounter: Payer: Self-pay | Admitting: Family Medicine

## 2024-02-14 ENCOUNTER — Telehealth: Payer: Self-pay

## 2024-02-14 VITALS — BP 118/72 | HR 92 | Temp 98.8°F | Ht 66.25 in | Wt 181.2 lb

## 2024-02-14 DIAGNOSIS — E611 Iron deficiency: Secondary | ICD-10-CM

## 2024-02-14 DIAGNOSIS — F430 Acute stress reaction: Secondary | ICD-10-CM | POA: Diagnosis not present

## 2024-02-14 DIAGNOSIS — Z0000 Encounter for general adult medical examination without abnormal findings: Secondary | ICD-10-CM | POA: Diagnosis not present

## 2024-02-14 DIAGNOSIS — D259 Leiomyoma of uterus, unspecified: Secondary | ICD-10-CM | POA: Diagnosis not present

## 2024-02-14 DIAGNOSIS — N920 Excessive and frequent menstruation with regular cycle: Secondary | ICD-10-CM

## 2024-02-14 LAB — COMPREHENSIVE METABOLIC PANEL WITH GFR
ALT: 9 U/L (ref 0–35)
AST: 17 U/L (ref 0–37)
Albumin: 4 g/dL (ref 3.5–5.2)
Alkaline Phosphatase: 54 U/L (ref 39–117)
BUN: 15 mg/dL (ref 6–23)
CO2: 24 meq/L (ref 19–32)
Calcium: 8.5 mg/dL (ref 8.4–10.5)
Chloride: 106 meq/L (ref 96–112)
Creatinine, Ser: 0.58 mg/dL (ref 0.40–1.20)
GFR: 123.76 mL/min (ref >60.00)
Glucose, Bld: 90 mg/dL (ref 70–99)
Potassium: 4.1 meq/L (ref 3.5–5.1)
Sodium: 137 meq/L (ref 135–145)
Total Bilirubin: 0.4 mg/dL (ref 0.2–1.2)
Total Protein: 7.5 g/dL (ref 6.0–8.3)

## 2024-02-14 LAB — CBC WITH DIFFERENTIAL/PLATELET
Basophils Absolute: 0 10*3/uL (ref 0.0–0.1)
Basophils Relative: 0.9 % (ref 0.0–3.0)
Eosinophils Absolute: 0.3 10*3/uL (ref 0.0–0.7)
Eosinophils Relative: 6.2 % — ABNORMAL HIGH (ref 0.0–5.0)
HCT: 23.6 % — CL (ref 36.0–46.0)
Hemoglobin: 6.9 g/dL — CL (ref 12.0–15.0)
Lymphocytes Relative: 34.3 % (ref 12.0–46.0)
Lymphs Abs: 1.8 10*3/uL (ref 0.7–4.0)
MCHC: 29.3 g/dL — ABNORMAL LOW (ref 30.0–36.0)
MCV: 62.7 fl — ABNORMAL LOW (ref 78.0–100.0)
Monocytes Absolute: 0.5 10*3/uL (ref 0.1–1.0)
Monocytes Relative: 9.4 % (ref 3.0–12.0)
Neutro Abs: 2.6 10*3/uL (ref 1.4–7.7)
Neutrophils Relative %: 49.2 % (ref 43.0–77.0)
Platelets: 570 10*3/uL — ABNORMAL HIGH (ref 150.0–400.0)
RBC: 3.77 Mil/uL — ABNORMAL LOW (ref 3.87–5.11)
RDW: 20.9 % — ABNORMAL HIGH (ref 11.5–15.5)
WBC: 5.2 10*3/uL (ref 4.0–10.5)

## 2024-02-14 LAB — LIPID PANEL
Cholesterol: 127 mg/dL (ref 0–200)
HDL: 53.8 mg/dL (ref >39.00)
LDL Cholesterol: 66 mg/dL (ref 0–99)
NonHDL: 72.92
Total CHOL/HDL Ratio: 2
Triglycerides: 36 mg/dL (ref 0.0–149.0)
VLDL: 7.2 mg/dL (ref 0.0–40.0)

## 2024-02-14 LAB — IRON: Iron: 11 ug/dL — ABNORMAL LOW (ref 42–145)

## 2024-02-14 MED ORDER — BUSPIRONE HCL 15 MG PO TABS: 7.5000 mg | ORAL_TABLET | Freq: Every day | ORAL | 3 refills | Status: AC | PRN

## 2024-02-14 NOTE — Assessment & Plan Note (Signed)
 Improved overall  Some anxious mood Likes new job and working at home  Will continue buspar  7.5 to 15 mg as needed   Encouraged to continue mental health counseling

## 2024-02-14 NOTE — Patient Instructions (Addendum)
 Stay active  Add some strength training to your routine, this is important for bone and brain health and can reduce your risk of falls and help your body use insulin properly and regulate weight  Light weights, exercise bands , and internet videos are a good way to start  Yoga (chair or regular), machines , floor exercises or a gym with machines are also good options   If you cannot take oral iron  we may need to consider IV iron   Let's see what labs look like   Take care of yourself

## 2024-02-14 NOTE — Assessment & Plan Note (Signed)
 Reviewed health habits including diet and exercise and skin cancer prevention Reviewed appropriate screening tests for age  Also reviewed health mt list, fam hx and immunization status , as well as social and family history   See HPI Labs reviewed and ordered Health Maintenance  Topic Date Due   COVID-19 Vaccine (4 - 2024-25 season) 07/09/2024*   Flu Shot  04/07/2024   Pap Smear  06/09/2024   DTaP/Tdap/Td vaccine (3 - Td or Tdap) 05/29/2027   HPV Vaccine  Completed   Hepatitis C Screening  Completed   HIV Screening  Completed   Meningitis B Vaccine  Aged Out  *Topic was postponed. The date shown is not the original due date.    Encouraged self breast exams Has gyn follow up in sept  Declines contraception  Discussed fall prevention, supplements and exercise for bone density  PHQ 4, gad7  6-taking buspar  prn

## 2024-02-14 NOTE — Assessment & Plan Note (Signed)
 Large Causing heavy bleeding and anemia  Encouraged strongly to discuss opt for treatment with gyn

## 2024-02-14 NOTE — Telephone Encounter (Signed)
 Pt notified of lab results and Dr. Belva Boyden comments. Pt does agree with hematology referral. Pt said she can go to either city [place] or [place].

## 2024-02-14 NOTE — Addendum Note (Signed)
 Addended by: Deri Fleet A on: 02/14/2024 08:31 PM   Modules accepted: Orders

## 2024-02-14 NOTE — Telephone Encounter (Signed)
 This looks like fairly severe iron  deficiency  Still pending a few more labs We briefly discussed hematology referral for iron  infusion today Is she open to that ?   Let me know , thanks

## 2024-02-14 NOTE — Progress Notes (Signed)
 Subjective:    Patient ID: Christine Carr, female    DOB: 12/20/1995, 28 y.o.   MRN: [id number]  HPI  Here for health maintenance exam and to review chronic medical problems   Wt Readings from Last 3 Encounters:  02/14/24 181 lb 4 oz (82.2 kg)  06/24/23 172 lb (78 kg)  11/11/22 169 lb (76.7 kg)   29.03 kg/m  Vitals:   02/14/24 0820  BP: 118/72  Pulse: 92  Temp: 98.8 F (37.1 C)  SpO2: 100%    Immunization History  Administered Date(s) Administered   DTaP 04/20/2007   HPV Quadrivalent 06/12/2013, 08/08/2013, 01/24/2014   Influenza,inj,Quad PF,6+ Mos 06/12/2013, 05/28/2017, 06/20/2019, 07/21/2022   Influenza-Unspecified 06/07/2018, 06/07/2020, 06/21/2021   Meningococcal Conjugate 01/24/2014   Meningococcal Mcv4o 01/24/2014   PFIZER Comirnaty(Gray Top)Covid-19 Tri-Sucrose Vaccine 09/27/2019, 10/17/2019   PFIZER(Purple Top)SARS-COV-2 Vaccination 05/05/2021   PPD Test 04/06/2018, 04/20/2018   Tdap 05/28/2017    There are no preventive care reminders to display for this patient.   Self breast exam-has not done one in a while  History of fibrocystic breasts   Gyn health Pap 06/2021 History of uterine fibroid  Very heavy bleeding  Has a pap scheduled for sept with gyn  Wants breast exam today   Menses Was prescription lysteda 650 mg 2 up to tid prn around menses by gyn  It helps when she takes it  Sometimes she forgets    Does not need contraception    Bone health   Falls-none  Fractures-none  Supplements - women's health mvi    Exercise :  None  Is interested in starting pilates- has new studio to go to / wants to start there  Wants to start walking      Mood    02/14/2024    8:37 AM 06/24/2023   10:28 AM 11/11/2022    8:36 AM 10/16/2022    8:03 AM 02/16/2022    9:07 AM  Depression screen PHQ 2/9  Decreased Interest 0 0 0 0 0  Down, Depressed, Hopeless 0 1 1 0 0  PHQ - 2 Score 0 1 1 0 0  Altered sleeping 1 1 1 1    Tired, decreased energy 2 1  1 1    Change in appetite 0 0 0 0   Feeling bad or failure about yourself  0 1 0 0   Trouble concentrating 0 0 1 0   Moving slowly or fidgety/restless 0 0 0 0   Suicidal thoughts 0 0 0 0   PHQ-9 Score 3 4 4 2    Difficult doing work/chores Not difficult at all Somewhat difficult Somewhat difficult Somewhat difficult       02/14/2024    8:38 AM 06/24/2023   10:28 AM 10/16/2022    8:04 AM  GAD 7 : Generalized Anxiety Score  Nervous, Anxious, on Edge 2 1 1   Control/stop worrying 2 1 1   Worry too much - different things 2 1 0  Trouble relaxing 0 0 1  Restless 0 0 0  Easily annoyed or irritable 0 0 0  Afraid - awful might happen 0 0 0  Total GAD 7 Score 6 3 3   Anxiety Difficulty Not difficult at all Somewhat difficult Somewhat difficult     Buspar  15 mg-is helping when she takes it (does make her sleepy)  Takes 1/2 to whole   Is in counseling also    Iron  def Has the iron  but both pills and liquid make her nauseated  Lab Results  Component Value Date   WBC 4.3 08/14/2021   HGB 10.5 (L) 08/14/2021   HCT 33.3 (L) 08/14/2021   MCV 80.5 08/14/2021   PLT 471.0 (H) 08/14/2021   Lab Results  Component Value Date   IRON  22 (L) 08/14/2021   TIBC 468 (H) 12/02/2020   FERRITIN 5 (L) 12/02/2020    Due for labs        Patient Active Problem List   Diagnosis Date Noted   Tachycardia 07/09/2021   Iron  deficiency 12/02/2020   Uterine fibroid 09/02/2020   Stress reaction 09/02/2020   Heavy menses 09/02/2020   Routine general medical examination at a health care facility 05/28/2017   Fibrocystic breast changes of both breasts 11/21/2013   Past Medical History:  Diagnosis Date   Iron  deficiency anemia due to chronic blood loss    Lump or mass in breast 2013   right    Pap smear of cervix with ASCUS, cannot exclude HGSIL    History reviewed. No pertinent surgical history. Social History   Tobacco Use   Smoking status: Never   Smokeless tobacco: Never  Substance Use  Topics   Alcohol use: Yes    Alcohol/week: 0.0 standard drinks of alcohol    Comment: socially   Drug use: No   Family History  Problem Relation Age of Onset   Lung cancer Maternal Aunt    CAD Maternal Uncle    No Known Allergies Current Outpatient Medications on File Prior to Visit  Medication Sig Dispense Refill   Ferrous Sulfate  (IRON  SUPPLEMENT) 300 MG/6.8ML SOLN Take 5 mLs by mouth daily. 473 mL 0   tranexamic acid (LYSTEDA) 650 MG TABS tablet Take 1,300 mg by mouth 3 (three) times daily as needed. For heavy menses     tretinoin (RETIN-A) 0.1 % cream Apply 1 application topically every other day.     No current facility-administered medications on file prior to visit.    Review of Systems  Constitutional:  Positive for fatigue. Negative for activity change, appetite change, fever and unexpected weight change.  HENT:  Negative for congestion, ear pain, rhinorrhea, sinus pressure and sore throat.   Eyes:  Negative for pain, redness and visual disturbance.  Respiratory:  Negative for cough, shortness of breath and wheezing.   Cardiovascular:  Negative for chest pain and palpitations.  Gastrointestinal:  Negative for abdominal pain, blood in stool, constipation and diarrhea.  Endocrine: Negative for polydipsia and polyuria.  Genitourinary:  Positive for menstrual problem. Negative for dysuria, frequency and urgency.  Musculoskeletal:  Negative for arthralgias, back pain and myalgias.  Skin:  Negative for pallor and rash.  Allergic/Immunologic: Negative for environmental allergies.  Neurological:  Negative for dizziness, syncope and headaches.  Hematological:  Negative for adenopathy. Does not bruise/bleed easily.  Psychiatric/Behavioral:  Negative for decreased concentration and dysphoric mood. The patient is not nervous/anxious.        Objective:   Physical Exam Constitutional:      General: She is not in acute distress.    Appearance: Normal appearance. She is  well-developed. She is not ill-appearing or diaphoretic.     Comments: Overwt   HENT:     Head: Normocephalic and atraumatic.     Right Ear: Tympanic membrane, ear canal and external ear normal.     Left Ear: Tympanic membrane, ear canal and external ear normal.     Nose: Nose normal. No congestion.     Mouth/Throat:     Mouth: Mucous  membranes are moist.     Pharynx: Oropharynx is clear. No posterior oropharyngeal erythema.  Eyes:     General: No scleral icterus.    Extraocular Movements: Extraocular movements intact.     Conjunctiva/sclera: Conjunctivae normal.     Pupils: Pupils are equal, round, and reactive to light.  Neck:     Thyroid: No thyromegaly.     Vascular: No carotid bruit or JVD.  Cardiovascular:     Rate and Rhythm: Normal rate and regular rhythm.     Pulses: Normal pulses.     Heart sounds: Normal heart sounds.     No gallop.  Pulmonary:     Effort: Pulmonary effort is normal. No respiratory distress.     Breath sounds: Normal breath sounds. No wheezing.     Comments: Good air exch Chest:     Chest wall: No tenderness.  Abdominal:     General: Bowel sounds are normal. There is no distension or abdominal bruit.     Palpations: Abdomen is soft. There is no mass.     Tenderness: There is no abdominal tenderness.     Hernia: No hernia is present.  Genitourinary:    Comments: Breast exam: No mass, nodules, thickening, tenderness, bulging, retraction, inflamation, nipple discharge or skin changes noted.  No axillary or clavicular LA.     Dense tissue noted  Musculoskeletal:        General: No tenderness. Normal range of motion.     Cervical back: Normal range of motion and neck supple. No rigidity. No muscular tenderness.     Right lower leg: No edema.     Left lower leg: No edema.     Comments: No kyphosis   Lymphadenopathy:     Cervical: No cervical adenopathy.  Skin:    General: Skin is warm and dry.     Coloration: Skin is not pale.     Findings: No  erythema or rash.     Comments: Mild acne  Neurological:     Mental Status: She is alert. Mental status is at baseline.     Cranial Nerves: No cranial nerve deficit.     Motor: No abnormal muscle tone.     Coordination: Coordination normal.     Gait: Gait normal.     Deep Tendon Reflexes: Reflexes are normal and symmetric. Reflexes normal.  Psychiatric:        Mood and Affect: Mood normal.        Cognition and Memory: Cognition and memory normal.           Assessment & Plan:   Problem List Items Addressed This Visit       Genitourinary   Uterine fibroid   Large Causing heavy bleeding and anemia  Encouraged strongly to discuss opt for treatment with gyn        Other   Stress reaction   Improved overall  Some anxious mood Likes new job and working at home  Will continue buspar  7.5 to 15 mg as needed   Encouraged to continue mental health counseling       Relevant Medications   busPIRone  (BUSPAR ) 15 MG tablet   Routine general medical examination at a health care facility - Primary   Reviewed health habits including diet and exercise and skin cancer prevention Reviewed appropriate screening tests for age  Also reviewed health mt list, fam hx and immunization status , as well as social and family history   See HPI Labs reviewed and  ordered Health Maintenance  Topic Date Due   COVID-19 Vaccine (4 - 2024-25 season) 07/09/2024*   Flu Shot  04/07/2024   Pap Smear  06/09/2024   DTaP/Tdap/Td vaccine (3 - Td or Tdap) 05/29/2027   HPV Vaccine  Completed   Hepatitis C Screening  Completed   HIV Screening  Completed   Meningitis B Vaccine  Aged Out  *Topic was postponed. The date shown is not the original due date.    Encouraged self breast exams Has gyn follow up in sept  Declines contraception  Discussed fall prevention, supplements and exercise for bone density  PHQ 4, gad7  6-taking buspar  prn       Relevant Orders   TSH   Lipid Panel   Comprehensive  metabolic panel with GFR   CBC with Differential/Platelet   Iron  deficiency   Lab today  From heavy menses (needs to discuss treatment opt for fibroid with her gyn)   Intol of pill/liquid iron  so far May need iron  infusion       Relevant Orders   Iron    CBC with Differential/Platelet   Ferritin   Heavy menses   With large fibroid  Encouraged strongly to discuss treatment opt with her gyn  On lysteeda for bleeding prn  Not on oc  Pt does not desire OC or need contraception   Intol of oral iron   Lab today

## 2024-02-14 NOTE — Assessment & Plan Note (Signed)
 With large fibroid  Encouraged strongly to discuss treatment opt with her gyn  On lysteeda for bleeding prn  Not on oc  Pt does not desire OC or need contraception   Intol of oral iron   Lab today

## 2024-02-14 NOTE — Assessment & Plan Note (Signed)
 Lab today  From heavy menses (needs to discuss treatment opt for fibroid with her gyn)   Intol of pill/liquid iron  so far May need iron  infusion

## 2024-02-14 NOTE — Telephone Encounter (Signed)
 I put the referral in for hematology  Please let us  know if you don't hear in 1-2 weeks to set that up

## 2024-02-14 NOTE — Telephone Encounter (Signed)
 CRITICAL VALUE STICKER  CRITICAL VALUE: HGB- 6.9, Hematocrit 23.6  RECEIVER (on-site recipient of call): Zyire Eidson   DATE & TIME NOTIFIED: 02/14/24 11:35am  MESSENGER (representative from lab): Mariah Shines  MD NOTIFIED: Malissa Se

## 2024-02-15 ENCOUNTER — Ambulatory Visit: Payer: Self-pay | Admitting: Family Medicine

## 2024-02-15 NOTE — Telephone Encounter (Signed)
 Pt advised.

## 2024-02-17 LAB — FERRITIN: Ferritin: 2.9 ng/mL — ABNORMAL LOW (ref 10.0–291.0)

## 2024-02-17 LAB — TSH: TSH: 0.92 u[IU]/mL (ref 0.35–5.50)

## 2024-02-19 DIAGNOSIS — F411 Generalized anxiety disorder: Secondary | ICD-10-CM | POA: Diagnosis not present

## 2024-03-02 DIAGNOSIS — F411 Generalized anxiety disorder: Secondary | ICD-10-CM | POA: Diagnosis not present

## 2024-03-30 DIAGNOSIS — F411 Generalized anxiety disorder: Secondary | ICD-10-CM | POA: Diagnosis not present

## 2024-04-03 ENCOUNTER — Encounter: Payer: Self-pay | Admitting: Family Medicine

## 2024-04-03 DIAGNOSIS — E611 Iron deficiency: Secondary | ICD-10-CM

## 2024-04-07 ENCOUNTER — Ambulatory Visit
Admission: EM | Admit: 2024-04-07 | Discharge: 2024-04-07 | Disposition: A | Discharge status: Home / Self Care | Attending: Emergency Medicine | Admitting: Emergency Medicine

## 2024-04-07 ENCOUNTER — Encounter: Payer: Self-pay | Admitting: Emergency Medicine

## 2024-04-07 DIAGNOSIS — H6121 Impacted cerumen, right ear: Secondary | ICD-10-CM | POA: Diagnosis not present

## 2024-04-07 NOTE — Discharge Instructions (Signed)
Today you were treated for ear fullness due to buildup of wax, ears have been irrigated with water  Moving forward you may use over-the-counter Debrox drops to help thin secretions making it easier to clean   may follow-up with his urgent care as needed if fullness recurs

## 2024-04-07 NOTE — ED Provider Notes (Signed)
 [facility]    CSN: [id number] Arrival date & time: 04/07/24  1808      History   Chief Complaint No chief complaint on file.   HPI Christine Carr is a 28 y.o. female.   Patient presents for evaluation of decreased hearing and ear pain occurring within the hour.  Has attempted use of Debrox drops which has been ineffective.  Denies fever, ear drainage, injury or trauma to the ear, nasal congestion or fever.  Past Medical History:  Diagnosis Date   Iron  deficiency anemia due to chronic blood loss    Lump or mass in breast 2013   right    Pap smear of cervix with ASCUS, cannot exclude HGSIL     Patient Active Problem List   Diagnosis Date Noted   Tachycardia 07/09/2021   Iron  deficiency 12/02/2020   Uterine fibroid 09/02/2020   Stress reaction 09/02/2020   Heavy menses 09/02/2020   Routine general medical examination at a health care facility 05/28/2017   Fibrocystic breast changes of both breasts 11/21/2013    History reviewed. No pertinent surgical history.  OB History     Gravida  0   Para      Term      Preterm      AB      Living         SAB      IAB      Ectopic      Multiple      Live Births           Obstetric Comments  First menstrual 13          Home Medications    Prior to Admission medications   Medication Sig Start Date End Date Taking? Authorizing Provider  busPIRone  (BUSPAR ) 15 MG tablet Take 0.5-1 tablets (7.5-15 mg total) by mouth daily as needed. 02/14/24   Tower, Laine LABOR, MD  Ferrous Sulfate  (IRON  SUPPLEMENT) 300 MG/6.8ML SOLN Take 5 mLs by mouth daily. 06/24/23   Tower, Laine LABOR, MD  tranexamic acid (LYSTEDA) 650 MG TABS tablet Take 1,300 mg by mouth 3 (three) times daily as needed. For heavy menses    [provider]  tretinoin (RETIN-A) 0.1 % cream Apply 1 application topically every other day. 01/04/20   [provider]    Family History Family History  Problem Relation Age of Onset    Lung cancer Maternal Aunt    CAD Maternal Uncle     Social History Social History   Tobacco Use   Smoking status: Never   Smokeless tobacco: Never  Substance Use Topics   Alcohol use: Yes    Alcohol/week: 0.0 standard drinks of alcohol    Comment: socially   Drug use: No     Allergies   Patient has no known allergies.   Review of Systems Review of Systems   Physical Exam Triage Vital Signs ED Triage Vitals  Encounter Vitals Group     BP 04/07/24 1821 129/86     Girls Systolic BP Percentile --      Girls Diastolic BP Percentile --      Boys Systolic BP Percentile --      Boys Diastolic BP Percentile --      Pulse Rate 04/07/24 1821 (!) 101     Resp 04/07/24 1821 18     Temp 04/07/24 1821 98.9 F (37.2 C)     Temp Source 04/07/24 1821 Oral     SpO2 04/07/24  1821 100 %     Weight --      Height --      Head Circumference --      Peak Flow --      Pain Score 04/07/24 1823 0     Pain Loc --      Pain Education --      Exclude from Growth Chart --    No data found.  Updated Vital Signs BP 129/86 (BP Location: Left Arm)   Pulse (!) 101   Temp 98.9 F (37.2 C) (Oral)   Resp 18   LMP 03/25/2024 (Exact Date)   SpO2 100%   Visual Acuity Right Eye Distance:   Left Eye Distance:   Bilateral Distance:    Right Eye Near:   Left Eye Near:    Bilateral Near:     Physical Exam Constitutional:      Appearance: Normal appearance.  HENT:     Right Ear: There is impacted cerumen.     Left Ear: Tympanic membrane, ear canal and external ear normal.  Eyes:     Extraocular Movements: Extraocular movements intact.  Pulmonary:     Effort: Pulmonary effort is normal.  Neurological:     Mental Status: She is alert and oriented to person, place, and time.      UC Treatments / Results  Labs (all labs ordered are listed, but only abnormal results are displayed) Labs Reviewed - No data to display  EKG   Radiology No results found.  Procedures Procedures  (including critical care time)  Medications Ordered in UC Medications - No data to display  Initial Impression / Assessment and Plan / UC Course  I have reviewed the triage vital signs and the nursing notes.  Pertinent labs & imaging results that were available during my care of the patient were reviewed by me and considered in my medical decision making (see chart for details).  Impacted cerumen of right ear  Cerumen impaction noted on exam, water irrigation completed by nursing staff, successful, some improvement with symptoms immediately after, no signs of infection on reevaluation, advised patient to monitor, if symptoms recur may return for reevaluation Final Clinical Impressions(s) / UC Diagnoses   Final diagnoses:  Impacted cerumen of right ear     Discharge Instructions      Today you were treated for ear fullness due to buildup of wax, ears have been irrigated with water  Moving forward you may use over-the-counter Debrox drops to help thin secretions making it easier to clean   may follow-up with his urgent care as needed if fullness recurs    ED Prescriptions   None    PDMP not reviewed this encounter.   Teresa Shelba SAUNDERS, NP 04/07/24 [phone]

## 2024-04-07 NOTE — ED Triage Notes (Signed)
 Patient reports right ear feeling clogged up that started about 1 hour ago. Patient put debrox in ear prior to arrival. Patient denies ear pain at this time.

## 2024-04-19 ENCOUNTER — Inpatient Hospital Stay: Attending: Internal Medicine | Admitting: Internal Medicine

## 2024-04-19 ENCOUNTER — Inpatient Hospital Stay

## 2024-04-19 ENCOUNTER — Encounter: Payer: Self-pay | Admitting: Internal Medicine

## 2024-04-19 VITALS — BP 129/83 | HR 100 | Temp 99.0°F | Resp 16 | Ht 66.5 in | Wt 179.0 lb

## 2024-04-19 DIAGNOSIS — E611 Iron deficiency: Secondary | ICD-10-CM

## 2024-04-19 DIAGNOSIS — D500 Iron deficiency anemia secondary to blood loss (chronic): Secondary | ICD-10-CM | POA: Insufficient documentation

## 2024-04-19 DIAGNOSIS — Z801 Family history of malignant neoplasm of trachea, bronchus and lung: Secondary | ICD-10-CM | POA: Insufficient documentation

## 2024-04-19 DIAGNOSIS — N920 Excessive and frequent menstruation with regular cycle: Secondary | ICD-10-CM | POA: Diagnosis not present

## 2024-04-19 LAB — CBC WITH DIFFERENTIAL/PLATELET
Abs Immature Granulocytes: 0.02 K/uL (ref 0.00–0.07)
Basophils Absolute: 0.1 K/uL (ref 0.0–0.1)
Basophils Relative: 2 %
Eosinophils Absolute: 0.2 K/uL (ref 0.0–0.5)
Eosinophils Relative: 4 %
HCT: 27.5 % — ABNORMAL LOW (ref 36.0–46.0)
Hemoglobin: 7.3 g/dL — ABNORMAL LOW (ref 12.0–15.0)
Immature Granulocytes: 0 %
Lymphocytes Relative: 29 %
Lymphs Abs: 1.8 K/uL (ref 0.7–4.0)
MCH: 18.1 pg — ABNORMAL LOW (ref 26.0–34.0)
MCHC: 26.5 g/dL — ABNORMAL LOW (ref 30.0–36.0)
MCV: 68.2 fL — ABNORMAL LOW (ref 80.0–100.0)
Monocytes Absolute: 0.5 K/uL (ref 0.1–1.0)
Monocytes Relative: 8 %
Neutro Abs: 3.7 K/uL (ref 1.7–7.7)
Neutrophils Relative %: 57 %
Platelets: 560 K/uL — ABNORMAL HIGH (ref 150–400)
RBC: 4.03 MIL/uL (ref 3.87–5.11)
RDW: 20.3 % — ABNORMAL HIGH (ref 11.5–15.5)
WBC: 6.3 K/uL (ref 4.0–10.5)
nRBC: 0 % (ref 0.0–0.2)

## 2024-04-19 LAB — COMPREHENSIVE METABOLIC PANEL WITH GFR
ALT: 10 U/L (ref 0–44)
AST: 19 U/L (ref 15–41)
Albumin: 3.8 g/dL (ref 3.5–5.0)
Alkaline Phosphatase: 52 U/L (ref 38–126)
Anion gap: 9 (ref 5–15)
BUN: 16 mg/dL (ref 6–20)
CO2: 22 mmol/L (ref 22–32)
Calcium: 9.1 mg/dL (ref 8.9–10.3)
Chloride: 103 mmol/L (ref 98–111)
Creatinine, Ser: 0.59 mg/dL (ref 0.44–1.00)
GFR, Estimated: >60 mL/min (ref >60)
Glucose, Bld: 99 mg/dL (ref 70–99)
Potassium: 3.7 mmol/L (ref 3.5–5.1)
Sodium: 134 mmol/L — ABNORMAL LOW (ref 135–145)
Total Bilirubin: 0.6 mg/dL (ref 0.0–1.2)
Total Protein: 8.6 g/dL — ABNORMAL HIGH (ref 6.5–8.1)

## 2024-04-19 LAB — PREGNANCY, URINE: Preg Test, Ur: NEGATIVE

## 2024-04-19 LAB — LACTATE DEHYDROGENASE: LDH: 124 U/L (ref 98–192)

## 2024-04-19 NOTE — Assessment & Plan Note (Addendum)
#  Iron  deficiency anemia secondary to chronic blood loss [heavy menstrual cycles]; hemoglobin  [JUNE 2025] 6.8; ferritin~2.9 [ Patient INTOLERANT p.o. iron  [nausea]  # Discussed regarding   IV iron  infusions.  Discussed the potential acute infusion reactions with IV iron ; which are quite rare.  Proceed with IV iron  infusions.   # heavy menstrual cycles [Dr.Cherry]:? Need for myomectomy- defer to Gyn.   Urin preg # DISPOSITION: # labs- today; # VENOFER  start ASAP- weekly x 4 # follow up in 3 months- MD; labs- cbc/cmp;iron  studies urin pregnancy test- ;possible venofer -Dr.B

## 2024-04-19 NOTE — Progress Notes (Signed)
 [facility] CONSULT NOTE  Patient Care Team: Tower, Laine LABOR, MD as PCP - General (Family Medicine) Dessa, Reyes ORN, MD as Consulting Physician (General Surgery) Rennie Cindy SAUNDERS, MD as Consulting Physician (Oncology)  CHIEF COMPLAINTS/PURPOSE OF CONSULTATION: ANEMIA   HEMATOLOGY HISTORY:  # CHRONIC ANEMIA [july2021-] Hb 10; Feritin ~5.[dec 2021] - on PO Iron /NO IV venofer .   #Heavy menstrual cycles:  inconsistent with BCPs.   HISTORY OF PRESENTING ILLNESS: Patient ambulating-independently.  Alone.   Christine Carr 28 y.o.  female iron  deficient anemia sec to heavy menstrual cycles  is here to re-establish care.   Patient states as tried oral and liquid iron . She cannot tolerate due to nausea. Fatigued all the time. Heavy menstrual cycles monthly. No visible blood in stool. Denies dizziness or dyspnea.    Review of Systems  Constitutional:  Positive for malaise/fatigue. Negative for chills, diaphoresis, fever and weight loss.  HENT:  Negative for nosebleeds and sore throat.   Eyes:  Negative for double vision.  Respiratory:  Negative for cough, hemoptysis, sputum production, shortness of breath and wheezing.   Cardiovascular:  Negative for chest pain, palpitations, orthopnea and leg swelling.  Gastrointestinal:  Negative for abdominal pain, blood in stool, constipation, diarrhea, heartburn, melena, nausea and vomiting.  Genitourinary:  Negative for dysuria, frequency and urgency.  Musculoskeletal:  Negative for back pain and joint pain.  Skin: Negative.  Negative for itching and rash.  Neurological:  Negative for dizziness, tingling, focal weakness, weakness and headaches.  Endo/Heme/Allergies:  Does not bruise/bleed easily.  Psychiatric/Behavioral:  Negative for depression. The patient is not nervous/anxious and does not have insomnia.     MEDICAL HISTORY:  Past Medical History:  Diagnosis Date   Iron  deficiency anemia due to chronic blood loss    Lump  or mass in breast 2013   right    Pap smear of cervix with ASCUS, cannot exclude HGSIL     SURGICAL HISTORY: History reviewed. No pertinent surgical history.  SOCIAL HISTORY: Social History   Socioeconomic History   Marital status: Single    Spouse name: Not on file   Number of children: Not on file   Years of education: Not on file   Highest education level: Not on file  Occupational History   Not on file  Tobacco Use   Smoking status: Never   Smokeless tobacco: Never  Substance and Sexual Activity   Alcohol use: Yes    Alcohol/week: 0.0 standard drinks of alcohol    Comment: socially   Drug use: No   Sexual activity: Never  Other Topics Concern   Not on file  Social History Narrative   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 No children; lives in [place].    Social Drivers of Corporate investment banker Strain: Not on file  Food Insecurity: No Food Insecurity (04/19/2024)   Hunger Vital Sign    Worried About Running Out of Food in the Last Year: Never true    Ran Out of Food in the Last Year: Never true  Transportation Needs: No Transportation Needs (04/19/2024)   PRAPARE - Administrator, Civil Service (Medical): No    Lack of Transportation (Non-Medical): No  Physical Activity: Not on file  Stress: Not on file  Social Connections: Not on file  Intimate Partner Violence: Not At Risk (04/19/2024)   Humiliation, Afraid, Rape, and Kick questionnaire    Fear of Current or Ex-Partner: No  Emotionally Abused: No    Physically Abused: No    Sexually Abused: No    FAMILY HISTORY: Family History  Problem Relation Age of Onset   Lung cancer Maternal Aunt    CAD Maternal Uncle     ALLERGIES:  has no known allergies.  MEDICATIONS:  Current Outpatient Medications  Medication Sig Dispense Refill   busPIRone  (BUSPAR ) 15 MG tablet Take 0.5-1 tablets (7.5-15 mg total) by mouth daily as needed. 90 tablet 3   tranexamic acid  (LYSTEDA) 650 MG TABS tablet Take 1,300 mg by mouth 3 (three) times daily as needed. For heavy menses     tretinoin (RETIN-A) 0.1 % cream Apply 1 application topically every other day.     No current facility-administered medications for this visit.      PHYSICAL EXAMINATION:   Vitals:   04/19/24 1100  BP: 129/83  Pulse: 100  Resp: 16  Temp: 99 F (37.2 C)  SpO2: 100%    Filed Weights   04/19/24 1100  Weight: 179 lb (81.2 kg)     Physical Exam HENT:     Head: Normocephalic and atraumatic.     Mouth/Throat:     Pharynx: No oropharyngeal exudate.  Eyes:     Pupils: Pupils are equal, round, and reactive to light.  Cardiovascular:     Rate and Rhythm: Normal rate and regular rhythm.  Pulmonary:     Effort: No respiratory distress.     Breath sounds: No wheezing.  Abdominal:     General: Bowel sounds are normal. There is no distension.     Palpations: Abdomen is soft. There is no mass.     Tenderness: There is no abdominal tenderness. There is no guarding or rebound.  Musculoskeletal:        General: No tenderness. Normal range of motion.     Cervical back: Normal range of motion and neck supple.  Skin:    General: Skin is warm.  Neurological:     Mental Status: She is alert and oriented to person, place, and time.  Psychiatric:        Mood and Affect: Affect normal.     LABORATORY DATA:  I have reviewed the data as listed Lab Results  Component Value Date   WBC 6.3 04/19/2024   HGB 7.3 (L) 04/19/2024   HCT 27.5 (L) 04/19/2024   MCV 68.2 (L) 04/19/2024   PLT 560 (H) 04/19/2024   Recent Labs    02/14/24 0848 04/19/24 1137  NA 137 134*  K 4.1 3.7  CL 106 103  CO2 24 22  GLUCOSE 90 99  BUN 15 16  CREATININE 0.58 0.59  CALCIUM 8.5 9.1  GFRNONAA  --  >60  PROT 7.5 8.6*  ALBUMIN 4.0 3.8  AST 17 19  ALT 9 10  ALKPHOS 54 52  BILITOT 0.4 0.6    No results found.  Iron  deficiency #Iron  deficiency anemia secondary to chronic blood loss [heavy  menstrual cycles]; hemoglobin  [JUNE 2025] 6.8; ferritin~2.9 [ Patient INTOLERANT p.o. iron  [nausea]  # Discussed regarding   IV iron  infusions.  Discussed the potential acute infusion reactions with IV iron ; which are quite rare.  Proceed with IV iron  infusions.   # heavy menstrual cycles [Dr.Cherry]:? Need for myomectomy- defer to Gyn.   Urin preg # DISPOSITION: # labs- today; # VENOFER  start ASAP- weekly x 4 # follow up in 3 months- MD; labs- cbc/cmp;iron  studies urin pregnancy test- ;possible venofer -Dr.B  All questions were  answered. The patient knows to call the clinic with any problems, questions or concerns.    Cindy JONELLE Joe, MD 04/19/2024 12:21 PM

## 2024-04-19 NOTE — Progress Notes (Signed)
 Has tried oral and liquid iron . She cannot tolerate due to nausea. Fatigued all the time. Heavy menstrual cycles monthly. No visible blood in stool. Denies dizziness or dyspnea.

## 2024-04-25 ENCOUNTER — Inpatient Hospital Stay

## 2024-04-25 VITALS — BP 144/63 | HR 91 | Resp 17

## 2024-04-25 DIAGNOSIS — Z801 Family history of malignant neoplasm of trachea, bronchus and lung: Secondary | ICD-10-CM | POA: Diagnosis not present

## 2024-04-25 DIAGNOSIS — E611 Iron deficiency: Secondary | ICD-10-CM

## 2024-04-25 DIAGNOSIS — N920 Excessive and frequent menstruation with regular cycle: Secondary | ICD-10-CM | POA: Diagnosis not present

## 2024-04-25 DIAGNOSIS — D500 Iron deficiency anemia secondary to blood loss (chronic): Secondary | ICD-10-CM | POA: Diagnosis not present

## 2024-04-25 MED ORDER — SODIUM CHLORIDE 0.9% FLUSH
10.0000 mL | Freq: Once | INTRAVENOUS | Status: DC | PRN
  Filled 2024-04-25: qty 10

## 2024-04-25 MED ORDER — IRON SUCROSE 20 MG/ML IV SOLN
200.0000 mg | Freq: Once | INTRAVENOUS | Status: AC
  Administered 2024-04-25: 200 mg via INTRAVENOUS
  Filled 2024-04-25: qty 10

## 2024-05-02 ENCOUNTER — Inpatient Hospital Stay

## 2024-05-02 VITALS — BP 117/77 | HR 1 | Temp 97.2°F | Resp 18

## 2024-05-02 DIAGNOSIS — N920 Excessive and frequent menstruation with regular cycle: Secondary | ICD-10-CM | POA: Diagnosis not present

## 2024-05-02 DIAGNOSIS — D500 Iron deficiency anemia secondary to blood loss (chronic): Secondary | ICD-10-CM | POA: Diagnosis not present

## 2024-05-02 DIAGNOSIS — Z801 Family history of malignant neoplasm of trachea, bronchus and lung: Secondary | ICD-10-CM | POA: Diagnosis not present

## 2024-05-02 DIAGNOSIS — E611 Iron deficiency: Secondary | ICD-10-CM

## 2024-05-02 MED ORDER — IRON SUCROSE 20 MG/ML IV SOLN
200.0000 mg | Freq: Once | INTRAVENOUS | Status: AC
  Administered 2024-05-02: 200 mg via INTRAVENOUS
  Filled 2024-05-02: qty 10

## 2024-05-02 NOTE — Patient Instructions (Signed)

## 2024-05-10 ENCOUNTER — Inpatient Hospital Stay: Attending: Internal Medicine

## 2024-05-10 VITALS — BP 122/76 | HR 82 | Temp 98.2°F | Resp 16

## 2024-05-10 DIAGNOSIS — E611 Iron deficiency: Secondary | ICD-10-CM

## 2024-05-10 DIAGNOSIS — N920 Excessive and frequent menstruation with regular cycle: Secondary | ICD-10-CM | POA: Insufficient documentation

## 2024-05-10 DIAGNOSIS — D500 Iron deficiency anemia secondary to blood loss (chronic): Secondary | ICD-10-CM | POA: Insufficient documentation

## 2024-05-10 MED ORDER — IRON SUCROSE 20 MG/ML IV SOLN
200.0000 mg | Freq: Once | INTRAVENOUS | Status: AC
  Administered 2024-05-10: 200 mg via INTRAVENOUS
  Filled 2024-05-10: qty 10

## 2024-05-10 NOTE — Patient Instructions (Signed)

## 2024-05-14 DIAGNOSIS — F411 Generalized anxiety disorder: Secondary | ICD-10-CM | POA: Diagnosis not present

## 2024-05-16 DIAGNOSIS — Z124 Encounter for screening for malignant neoplasm of cervix: Secondary | ICD-10-CM | POA: Diagnosis not present

## 2024-05-16 DIAGNOSIS — D251 Intramural leiomyoma of uterus: Secondary | ICD-10-CM | POA: Diagnosis not present

## 2024-05-16 DIAGNOSIS — Z01419 Encounter for gynecological examination (general) (routine) without abnormal findings: Secondary | ICD-10-CM | POA: Diagnosis not present

## 2024-05-16 DIAGNOSIS — D252 Subserosal leiomyoma of uterus: Secondary | ICD-10-CM | POA: Diagnosis not present

## 2024-05-17 ENCOUNTER — Inpatient Hospital Stay

## 2024-05-25 DIAGNOSIS — F411 Generalized anxiety disorder: Secondary | ICD-10-CM | POA: Diagnosis not present

## 2024-05-26 ENCOUNTER — Inpatient Hospital Stay

## 2024-06-08 DIAGNOSIS — F411 Generalized anxiety disorder: Secondary | ICD-10-CM | POA: Diagnosis not present

## 2024-07-06 DIAGNOSIS — D252 Subserosal leiomyoma of uterus: Secondary | ICD-10-CM | POA: Diagnosis not present

## 2024-07-06 DIAGNOSIS — D250 Submucous leiomyoma of uterus: Secondary | ICD-10-CM | POA: Diagnosis not present

## 2024-07-06 DIAGNOSIS — D251 Intramural leiomyoma of uterus: Secondary | ICD-10-CM | POA: Diagnosis not present

## 2024-07-10 DIAGNOSIS — D250 Submucous leiomyoma of uterus: Secondary | ICD-10-CM | POA: Diagnosis not present

## 2024-07-10 DIAGNOSIS — D500 Iron deficiency anemia secondary to blood loss (chronic): Secondary | ICD-10-CM | POA: Diagnosis not present

## 2024-07-10 DIAGNOSIS — D251 Intramural leiomyoma of uterus: Secondary | ICD-10-CM | POA: Diagnosis not present

## 2024-07-20 ENCOUNTER — Inpatient Hospital Stay

## 2024-07-20 ENCOUNTER — Inpatient Hospital Stay: Admitting: Internal Medicine

## 2024-08-21 ENCOUNTER — Inpatient Hospital Stay: Admitting: Internal Medicine

## 2024-08-21 ENCOUNTER — Inpatient Hospital Stay

## 2024-08-21 ENCOUNTER — Encounter: Payer: Self-pay | Admitting: Internal Medicine

## 2024-08-21 ENCOUNTER — Inpatient Hospital Stay: Attending: Internal Medicine

## 2024-08-21 VITALS — BP 118/78 | HR 87

## 2024-08-21 VITALS — BP 138/91 | HR 98 | Temp 98.9°F | Resp 16 | Ht 66.5 in | Wt 179.1 lb

## 2024-08-21 DIAGNOSIS — N920 Excessive and frequent menstruation with regular cycle: Secondary | ICD-10-CM | POA: Diagnosis present

## 2024-08-21 DIAGNOSIS — E611 Iron deficiency: Secondary | ICD-10-CM

## 2024-08-21 DIAGNOSIS — D500 Iron deficiency anemia secondary to blood loss (chronic): Secondary | ICD-10-CM | POA: Diagnosis present

## 2024-08-21 LAB — CMP (CANCER CENTER ONLY)
ALT: 12 U/L (ref 0–44)
AST: 18 U/L (ref 15–41)
Albumin: 4.4 g/dL (ref 3.5–5.0)
Alkaline Phosphatase: 77 U/L (ref 38–126)
Anion gap: 11 (ref 5–15)
BUN: 16 mg/dL (ref 6–20)
CO2: 23 mmol/L (ref 22–32)
Calcium: 9.5 mg/dL (ref 8.9–10.3)
Chloride: 102 mmol/L (ref 98–111)
Creatinine: 0.79 mg/dL (ref 0.44–1.00)
GFR, Estimated: >60 mL/min (ref >60)
Glucose, Bld: 97 mg/dL (ref 70–99)
Potassium: 3.7 mmol/L (ref 3.5–5.1)
Sodium: 136 mmol/L (ref 135–145)
Total Bilirubin: 0.4 mg/dL (ref 0.0–1.2)
Total Protein: 8.6 g/dL — ABNORMAL HIGH (ref 6.5–8.1)

## 2024-08-21 LAB — CBC WITH DIFFERENTIAL (CANCER CENTER ONLY)
Abs Immature Granulocytes: 0.03 K/uL (ref 0.00–0.07)
Basophils Absolute: 0.1 K/uL (ref 0.0–0.1)
Basophils Relative: 1 %
Eosinophils Absolute: 0.3 K/uL (ref 0.0–0.5)
Eosinophils Relative: 4 %
HCT: 31.1 % — ABNORMAL LOW (ref 36.0–46.0)
Hemoglobin: 8.9 g/dL — ABNORMAL LOW (ref 12.0–15.0)
Immature Granulocytes: 0 %
Lymphocytes Relative: 32 %
Lymphs Abs: 2.4 K/uL (ref 0.7–4.0)
MCH: 20.7 pg — ABNORMAL LOW (ref 26.0–34.0)
MCHC: 28.6 g/dL — ABNORMAL LOW (ref 30.0–36.0)
MCV: 72.3 fL — ABNORMAL LOW (ref 80.0–100.0)
Monocytes Absolute: 0.5 K/uL (ref 0.1–1.0)
Monocytes Relative: 7 %
Neutro Abs: 4.2 K/uL (ref 1.7–7.7)
Neutrophils Relative %: 56 %
Platelet Count: 582 K/uL — ABNORMAL HIGH (ref 150–400)
RBC: 4.3 MIL/uL (ref 3.87–5.11)
RDW: 19 % — ABNORMAL HIGH (ref 11.5–15.5)
WBC Count: 7.5 K/uL (ref 4.0–10.5)
nRBC: 0 % (ref 0.0–0.2)

## 2024-08-21 LAB — IRON AND TIBC
Iron: 17 ug/dL — ABNORMAL LOW (ref 28–170)
Saturation Ratios: 4 % — ABNORMAL LOW (ref 10.4–31.8)
TIBC: 437 ug/dL (ref 250–450)
UIBC: 420 ug/dL

## 2024-08-21 LAB — FERRITIN: Ferritin: 5 ng/mL — ABNORMAL LOW (ref 11–307)

## 2024-08-21 LAB — PREGNANCY, URINE: Preg Test, Ur: NEGATIVE

## 2024-08-21 MED ORDER — IRON SUCROSE 20 MG/ML IV SOLN
200.0000 mg | Freq: Once | INTRAVENOUS | Status: AC
  Administered 2024-08-21: 16:00:00 200 mg via INTRAVENOUS

## 2024-08-21 MED ORDER — SODIUM CHLORIDE 0.9% FLUSH
10.0000 mL | Freq: Once | INTRAVENOUS | Status: AC | PRN
  Administered 2024-08-21: 16:00:00 10 mL
  Filled 2024-08-21: qty 10

## 2024-08-21 NOTE — Assessment & Plan Note (Addendum)
#  Iron  deficiency anemia secondary to chronic blood loss [heavy menstrual cycles]; hemoglobin  [JUNE 2025] 6.8; ferritin~2.9 [ Patient INTOLERANT p.o. iron  [nausea].   #Recommend gentle iron  [iron  biglycinate; 28 mg ] 1 pill a day.  This pill is unlikely to cause stomach upset or cause constipation.  Available Over the counter or talk to pharmacist. Also proceed with IV iron  infusions.   # heavy menstrual cycles [Dr.Cherry]:? Need for myomectomy-awaiting surgery - [facility] in Late Feb 2026.   Urin preg # DISPOSITION: # VENOFER   # venofer -  weekly x  3 more- # follow up in 3 months- MD; labs- cbc/cmp;iron  studies urin pregnancy test ;possible venofer -Dr.B

## 2024-08-21 NOTE — Progress Notes (Signed)
 Fatigue/weakness: NO  Dyspena: NO  Light headedness: NO  Blood in stool:NO    MRI PELVIS AT [facility]. Sch'd for fibroid surgery 10/31/23.

## 2024-08-21 NOTE — Patient Instructions (Signed)
#  Recommend gentle iron  [iron  biglycinate; 28 mg ] 1 pill a day.  This pill is unlikely to cause stomach upset or cause constipation.  Available Over the counter or talk to pharmacist.

## 2024-08-21 NOTE — Progress Notes (Signed)
 Christine Carr  Patient Care Team: Tower, Laine LABOR, MD as PCP - General (Family Medicine) Dessa, Reyes ORN, MD as Consulting Physician (General Surgery) Rennie Cindy SAUNDERS, MD as Consulting Physician (Oncology)  CHIEF COMPLAINTS/PURPOSE OF CONSULTATION: ANEMIA   HEMATOLOGY HISTORY:  # CHRONIC ANEMIA [july2021-] Hb 10; Feritin ~5.[dec 2021] - on PO Iron /NO IV venofer .   #Heavy menstrual cycles:  inconsistent with BCPs.   HISTORY OF PRESENTING ILLNESS: Patient ambulating-independently.  Alone.   Christine Carr 28 y.o.  female iron  deficient anemia sec to heavy menstrual cycles  is here for a follow up.  Discussed the use of AI scribe software for clinical Carr transcription with the patient, who gave verbal consent to proceed.  History of Present Illness   Christine Carr is a 28 year old female with iron  deficiency anemia secondary to chronic heavy menstrual bleeding who presents for hematology follow-up to optimize iron  stores prior to scheduled myomectomy.  Iron  deficiency anemia remains persistent due to ongoing menorrhagia from a uterine polyp. Hemoglobin has improved from a nadir of 6.9 g/dL in June to 7.3 g/dL and most recently 8.9 g/dL, but remains below the threshold required for surgical clearance. She reports that the improvement is insufficient and menstrual bleeding continues to be heavy. She is scheduled for myomectomy on February 23rd to remove the uterine polyp, with the goal of fertility preservation.  She has previously attempted oral iron  supplementation, including pills and liquid formulations, but experienced significant gastrointestinal intolerance. She is willing to retry oral iron  until surgery, as her gynecologist has requested higher iron  levels preoperatively. She is currently scheduled for a series of four weekly iron  infusions, beginning today, to further increase iron  stores prior to surgery.  She inquired about her elevated platelet  count, referencing prior laboratory results and online information. She expressed concern regarding malignancy, but was reassured that the thrombocytosis is reactive and commonly associated with iron  deficiency.      Review of Systems  Constitutional:  Positive for malaise/fatigue. Negative for chills, diaphoresis, fever and weight loss.  HENT:  Negative for nosebleeds and sore throat.   Eyes:  Negative for double vision.  Respiratory:  Negative for cough, hemoptysis, sputum production, shortness of breath and wheezing.   Cardiovascular:  Negative for chest pain, palpitations, orthopnea and leg swelling.  Gastrointestinal:  Negative for abdominal pain, blood in stool, constipation, diarrhea, heartburn, melena, nausea and vomiting.  Genitourinary:  Negative for dysuria, frequency and urgency.  Musculoskeletal:  Negative for back pain and joint pain.  Skin: Negative.  Negative for itching and rash.  Neurological:  Negative for dizziness, tingling, focal weakness, weakness and headaches.  Endo/Heme/Allergies:  Does not bruise/bleed easily.  Psychiatric/Behavioral:  Negative for depression. The patient is not nervous/anxious and does not have insomnia.     MEDICAL HISTORY:  Past Medical History:  Diagnosis Date   Iron  deficiency anemia due to chronic blood loss    Lump or mass in breast 2013   right    Pap smear of cervix with ASCUS, cannot exclude HGSIL     SURGICAL HISTORY: History reviewed. No pertinent surgical history.  SOCIAL HISTORY: Social History   Socioeconomic History   Marital status: Single    Spouse name: Not on file   Number of children: Not on file   Years of education: Not on file   Highest education level: Not on file  Occupational History   Not on file  Tobacco Use   Smoking status: Never   Smokeless  tobacco: Never  Substance and Sexual Activity   Alcohol use: Yes    Alcohol/week: 0.0 standard drinks of alcohol    Comment: socially   Drug use: No    Sexual activity: Never  Other Topics Concern   Not on file  Social History Narrative   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 No children; lives in [place].    Social Drivers of Health   Tobacco Use: Low Risk (08/21/2024)   Patient History    Smoking Tobacco Use: Never    Smokeless Tobacco Use: Never    Passive Exposure: Not on file  Financial Resource Strain: Low Risk  (05/15/2024)   Received from [facility]   Overall Financial Resource Strain (CARDIA)    Difficulty of Paying Living Expenses: Not very hard  Food Insecurity: No Food Insecurity (07/10/2024)   Received from [facility]   Epic    Within the past 12 months, you worried that your food would run out before you got the money to buy more.: Never true    Within the past 12 months, the food you bought just didn't last and you didn't have money to get more.: Never true  Transportation Needs: No Transportation Needs (07/10/2024)   Received from [facility] - Transportation    In the past 12 months, has lack of transportation kept you from medical appointments or from getting medications?: No    Lack of Transportation (Non-Medical): No  Physical Activity: Not on file  Stress: Not on file  Social Connections: Not on file  Intimate Partner Violence: Not At Risk (04/19/2024)   Epic    Fear of Current or Ex-Partner: No    Emotionally Abused: No    Physically Abused: No    Sexually Abused: No  Depression (PHQ2-9): Low Risk (08/21/2024)   Depression (PHQ2-9)    PHQ-2 Score: 0  Alcohol Screen: Not on file  Housing: Low Risk  (05/15/2024)   Received from [facility]   Epic    In the last 12 months, was there a time when you were not able to pay the mortgage or rent on time?: No    In the past 12 months, how many times have you moved where you were living?: 0    At any time in the past 12 months, were you homeless or  living in a shelter (including now)?: No  Utilities: Not At Risk (05/15/2024)   Received from [facility]   Epic    In the past 12 months has the electric, gas, oil, or water company threatened to shut off services in your home?: No  Health Literacy: Not on file    FAMILY HISTORY: Family History  Problem Relation Age of Onset   Lung cancer Maternal Aunt    CAD Maternal Uncle     ALLERGIES:  has no known allergies.  MEDICATIONS:  Current Outpatient Medications  Medication Sig Dispense Refill   busPIRone  (BUSPAR ) 15 MG tablet Take 0.5-1 tablets (7.5-15 mg total) by mouth daily as needed. 90 tablet 3   tranexamic acid (LYSTEDA) 650 MG TABS tablet Take 1,300 mg by mouth 3 (three) times daily as needed. For heavy menses     No current facility-administered medications for this visit.   Facility-Administered Medications Ordered in Other Visits  Medication Dose Route Frequency Provider Last Rate Last Admin   iron  sucrose (VENOFER ) injection 200 mg  200  mg Intravenous Once Sasuke Yaffe R, MD       sodium chloride  flush (NS) 0.9 % injection 10 mL  10 mL Intracatheter Once PRN Phoebie Shad R, MD          PHYSICAL EXAMINATION:   Vitals:   08/21/24 1502  BP: (!) 138/91  Pulse: 98  Resp: 16  Temp: 98.9 F (37.2 C)  SpO2: 99%    Filed Weights   08/21/24 1502  Weight: 179 lb 1.6 oz (81.2 kg)     Physical Exam HENT:     Head: Normocephalic and atraumatic.     Mouth/Throat:     Pharynx: No oropharyngeal exudate.  Eyes:     Pupils: Pupils are equal, round, and reactive to light.  Cardiovascular:     Rate and Rhythm: Normal rate and regular rhythm.  Pulmonary:     Effort: No respiratory distress.     Breath sounds: No wheezing.  Abdominal:     General: Bowel sounds are normal. There is no distension.     Palpations: Abdomen is soft. There is no mass.     Tenderness: There is no abdominal tenderness. There is no guarding or rebound.   Musculoskeletal:        General: No tenderness. Normal range of motion.     Cervical back: Normal range of motion and neck supple.  Skin:    General: Skin is warm.  Neurological:     Mental Status: She is alert and oriented to person, place, and time.  Psychiatric:        Mood and Affect: Affect normal.     LABORATORY DATA:  I have reviewed the data as listed Lab Results  Component Value Date   WBC 7.5 08/21/2024   HGB 8.9 (L) 08/21/2024   HCT 31.1 (L) 08/21/2024   MCV 72.3 (L) 08/21/2024   PLT 582 (H) 08/21/2024   Recent Labs    02/14/24 0848 04/19/24 1137 08/21/24 1454  NA 137 134* 136  K 4.1 3.7 3.7  CL 106 103 102  CO2 24 22 23   GLUCOSE 90 99 97  BUN 15 16 16   CREATININE 0.58 0.59 0.79  CALCIUM 8.5 9.1 9.5  GFRNONAA  --  >60 >60  PROT 7.5 8.6* 8.6*  ALBUMIN 4.0 3.8 4.4  AST 17 19 18   ALT 9 10 12   ALKPHOS 54 52 77  BILITOT 0.4 0.6 0.4    No results found.  Iron  deficiency #Iron  deficiency anemia secondary to chronic blood loss [heavy menstrual cycles]; hemoglobin  [JUNE 2025] 6.8; ferritin~2.9 [ Patient INTOLERANT p.o. iron  [nausea].   #Recommend gentle iron  [iron  biglycinate; 28 mg ] 1 pill a day.  This pill is unlikely to cause stomach upset or cause constipation.  Available Over the counter or talk to pharmacist. Also proceed with IV iron  infusions.   # heavy menstrual cycles [Dr.Cherry]:? Need for myomectomy-awaiting surgery - [facility] in Late Feb 2026.   Urin preg # DISPOSITION: # VENOFER   # venofer -  weekly x  3 more- # follow up in 3 months- MD; labs- cbc/cmp;iron  studies urin pregnancy test ;possible venofer -Dr.B   All questions were answered. The patient knows to call the clinic with any problems, questions or concerns.    Cindy JONELLE Joe, MD 08/21/2024 3:34 PM

## 2024-08-28 ENCOUNTER — Inpatient Hospital Stay

## 2024-08-28 VITALS — BP 134/90 | HR 98 | Temp 97.3°F | Resp 18

## 2024-08-28 DIAGNOSIS — E611 Iron deficiency: Secondary | ICD-10-CM

## 2024-08-28 DIAGNOSIS — D500 Iron deficiency anemia secondary to blood loss (chronic): Secondary | ICD-10-CM | POA: Diagnosis not present

## 2024-08-28 MED ORDER — IRON SUCROSE 20 MG/ML IV SOLN
200.0000 mg | Freq: Once | INTRAVENOUS | Status: AC
  Administered 2024-08-28: 200 mg via INTRAVENOUS

## 2024-09-04 ENCOUNTER — Inpatient Hospital Stay

## 2024-09-04 VITALS — BP 128/88 | HR 89 | Temp 98.3°F | Resp 18

## 2024-09-04 DIAGNOSIS — D500 Iron deficiency anemia secondary to blood loss (chronic): Secondary | ICD-10-CM | POA: Diagnosis not present

## 2024-09-04 DIAGNOSIS — E611 Iron deficiency: Secondary | ICD-10-CM

## 2024-09-04 MED ORDER — IRON SUCROSE 20 MG/ML IV SOLN
200.0000 mg | Freq: Once | INTRAVENOUS | Status: AC
  Administered 2024-09-04: 200 mg via INTRAVENOUS
  Filled 2024-09-04: qty 10

## 2024-09-04 MED ORDER — SODIUM CHLORIDE 0.9% FLUSH
10.0000 mL | Freq: Once | INTRAVENOUS | Status: AC | PRN
  Administered 2024-09-04: 10 mL
  Filled 2024-09-04: qty 10

## 2024-09-11 ENCOUNTER — Encounter: Admitting: Physician Assistant

## 2024-09-11 ENCOUNTER — Ambulatory Visit
Admission: EM | Admit: 2024-09-11 | Discharge: 2024-09-11 | Disposition: A | Discharge status: Home / Self Care | Payer: Self-pay | Attending: Emergency Medicine | Admitting: Emergency Medicine

## 2024-09-11 ENCOUNTER — Inpatient Hospital Stay: Payer: Self-pay

## 2024-09-11 DIAGNOSIS — R110 Nausea: Secondary | ICD-10-CM | POA: Diagnosis not present

## 2024-09-11 DIAGNOSIS — B349 Viral infection, unspecified: Secondary | ICD-10-CM

## 2024-09-11 LAB — POCT RAPID STREP A (OFFICE): Rapid Strep A Screen: NEGATIVE

## 2024-09-11 MED ORDER — ONDANSETRON 4 MG PO TBDP: 4.0000 mg | ORAL_TABLET | Freq: Three times a day (TID) | ORAL | 0 refills | Status: AC | PRN

## 2024-09-11 MED ORDER — PSEUDOEPH-BROMPHEN-DM 30-2-10 MG/5ML PO SYRP: 5.0000 mL | ORAL_SOLUTION | Freq: Four times a day (QID) | ORAL | 0 refills | Status: AC | PRN

## 2024-09-11 NOTE — ED Provider Notes (Signed)
 " [facility]    CSN: [id number] Arrival date & time: 09/11/24  0841      History   Chief Complaint Chief Complaint  Patient presents with   Cough   Sore Throat   Nasal Congestion    HPI Christine Carr is a 29 y.o. female.  Patient presents with 3-day history of sore throat, congestion, runny nose, cough, nausea.  No fever, shortness of breath, vomiting, diarrhea.  She has been treating her symptoms with Robitussin, Alka-Seltzer, cough drops.  She is concerned for possible strep throat and asked for testing.  The history is provided by the patient and medical records.    Past Medical History:  Diagnosis Date   Iron  deficiency anemia due to chronic blood loss    Lump or mass in breast 2013   right    Pap smear of cervix with ASCUS, cannot exclude HGSIL     Patient Active Problem List   Diagnosis Date Noted   Tachycardia 07/09/2021   Iron  deficiency 12/02/2020   Uterine fibroid 09/02/2020   Stress reaction 09/02/2020   Heavy menses 09/02/2020   Routine general medical examination at a health care facility 05/28/2017   Fibrocystic breast changes of both breasts 11/21/2013    History reviewed. No pertinent surgical history.  OB History     Gravida  0   Para      Term      Preterm      AB      Living         SAB      IAB      Ectopic      Multiple      Live Births           Obstetric Comments  First menstrual 13          Home Medications    Prior to Admission medications  Medication Sig Start Date End Date Taking? Authorizing Provider  brompheniramine-pseudoephedrine-DM 30-2-10 MG/5ML syrup Take 5 mLs by mouth 4 (four) times daily as needed. 09/11/24  Yes Corlis Burnard DEL, NP  busPIRone  (BUSPAR ) 15 MG tablet Take 0.5-1 tablets (7.5-15 mg total) by mouth daily as needed. 02/14/24  Yes Tower, Laine LABOR, MD  ondansetron  (ZOFRAN -ODT) 4 MG disintegrating tablet Take 1 tablet (4 mg total) by mouth every 8 (eight) hours as needed for nausea or  vomiting. 09/11/24  Yes Corlis Burnard DEL, NP  tranexamic acid (LYSTEDA) 650 MG TABS tablet Take 1,300 mg by mouth 3 (three) times daily as needed. For heavy menses    [provider]    Family History Family History  Problem Relation Age of Onset   Lung cancer Maternal Aunt    CAD Maternal Uncle     Social History Social History[1]   Allergies   Patient has no known allergies.   Review of Systems Review of Systems  Constitutional:  Negative for chills and fever.  HENT:  Positive for congestion, rhinorrhea and sore throat. Negative for ear pain.   Respiratory:  Positive for cough. Negative for shortness of breath.   Gastrointestinal:  Positive for nausea. Negative for diarrhea and vomiting.     Physical Exam Triage Vital Signs ED Triage Vitals  Encounter Vitals Group     BP 09/11/24 0920 (!) 142/88     Girls Systolic BP Percentile --      Girls Diastolic BP Percentile --      Boys Systolic BP Percentile --  Boys Diastolic BP Percentile --      Pulse Rate 09/11/24 0920 100     Resp 09/11/24 0920 18     Temp 09/11/24 0920 98.3 F (36.8 C)     Temp src --      SpO2 09/11/24 0920 98 %     Weight --      Height --      Head Circumference --      Peak Flow --      Pain Score 09/11/24 0919 0     Pain Loc --      Pain Education --      Exclude from Growth Chart --    No data found.  Updated Vital Signs BP (!) 142/88   Pulse 100   Temp 98.3 F (36.8 C)   Resp 18   LMP 09/04/2024 (Exact Date)   SpO2 98%   Visual Acuity Right Eye Distance:   Left Eye Distance:   Bilateral Distance:    Right Eye Near:   Left Eye Near:    Bilateral Near:     Physical Exam Constitutional:      General: She is not in acute distress. HENT:     Right Ear: Tympanic membrane normal.     Left Ear: Tympanic membrane normal.     Nose: Nose normal.     Mouth/Throat:     Mouth: Mucous membranes are moist.     Pharynx: Oropharynx is clear.     Comments:  PND Cardiovascular:     Rate and Rhythm: Normal rate and regular rhythm.     Heart sounds: Normal heart sounds.  Pulmonary:     Effort: Pulmonary effort is normal. No respiratory distress.     Breath sounds: Normal breath sounds.  Neurological:     Mental Status: She is alert.      UC Treatments / Results  Labs (all labs ordered are listed, but only abnormal results are displayed) Labs Reviewed  POCT RAPID STREP A (OFFICE)    EKG   Radiology No results found.  Procedures Procedures (including critical care time)  Medications Ordered in UC Medications - No data to display  Initial Impression / Assessment and Plan / UC Course  I have reviewed the triage vital signs and the nursing notes.  Pertinent labs & imaging results that were available during my care of the patient were reviewed by me and considered in my medical decision making (see chart for details).    Viral illness, nausea without vomiting.  Afebrile and vital signs are stable.  Lungs are clear and O2 sat is 98% on room air.  Rapid strep negative.  Treating today with Zofran  and Bromfed-DM.  Education provided on viral illness and nausea.  ED precautions given.  Instructed patient to follow-up with her PCP.  She agrees to plan of care.  Final Clinical Impressions(s) / UC Diagnoses   Final diagnoses:  Viral illness  Nausea without vomiting     Discharge Instructions      Your strep test is negative.    Take the Bromfed-DM as directed for cough and congestion.    Take the antinausea medication as directed.  Keep yourself hydrated with clear liquids, such as water.  Advance your diet as tolerated.   Go to the emergency department if you have worsening symptoms.    Follow up with your primary care provider.        ED Prescriptions     Medication Sig Dispense Auth.  Provider   ondansetron  (ZOFRAN -ODT) 4 MG disintegrating tablet Take 1 tablet (4 mg total) by mouth every 8 (eight) hours as needed  for nausea or vomiting. 10 tablet Corlis Burnard DEL, NP   brompheniramine-pseudoephedrine-DM 30-2-10 MG/5ML syrup Take 5 mLs by mouth 4 (four) times daily as needed. 120 mL Corlis Burnard DEL, NP      PDMP not reviewed this encounter.    [1]  Social History Tobacco Use   Smoking status: Never   Smokeless tobacco: Never  Substance Use Topics   Alcohol use: Yes    Alcohol/week: 0.0 standard drinks of alcohol    Comment: socially   Drug use: No     Corlis Burnard DEL, NP 09/11/24 [phone]  "

## 2024-09-11 NOTE — ED Triage Notes (Signed)
 Patient to Urgent Care with complaints of  sore throat/ nasal congestion/ nausea/ cough. No known fevers.   Symptoms started Friday.   Taking robitussin/ alka-seltzer/ cough drops

## 2024-09-11 NOTE — Discharge Instructions (Addendum)
 Your strep test is negative.    Take the Bromfed-DM as directed for cough and congestion.    Take the antinausea medication as directed.  Keep yourself hydrated with clear liquids, such as water.  Advance your diet as tolerated.   Go to the emergency department if you have worsening symptoms.    Follow up with your primary care provider.

## 2024-09-11 NOTE — Progress Notes (Signed)
 Duplicate.  Patient at [facility].

## 2024-09-11 NOTE — Telephone Encounter (Signed)
 This encounter was created in error - please disregard.

## 2024-09-12 ENCOUNTER — Ambulatory Visit: Admitting: Family Medicine

## 2024-09-14 ENCOUNTER — Encounter: Payer: Self-pay | Admitting: Family Medicine

## 2024-09-14 NOTE — Telephone Encounter (Signed)
 Please schedule in person visit if possible since this is a continued thing, may need imaging

## 2024-09-15 ENCOUNTER — Encounter: Payer: Self-pay | Admitting: Internal Medicine

## 2024-09-18 ENCOUNTER — Inpatient Hospital Stay: Payer: Self-pay

## 2024-09-18 ENCOUNTER — Ambulatory Visit: Admitting: Family Medicine

## 2024-09-18 ENCOUNTER — Telehealth: Payer: Self-pay | Admitting: Internal Medicine

## 2024-09-18 NOTE — Telephone Encounter (Signed)
 Patient called to reschedule appointment. She is still not feeling well.  Appointment rescheduled as requested

## 2024-09-19 ENCOUNTER — Encounter: Payer: Self-pay | Admitting: Internal Medicine

## 2024-09-25 ENCOUNTER — Encounter: Payer: Self-pay | Admitting: Internal Medicine

## 2024-09-25 ENCOUNTER — Inpatient Hospital Stay: Payer: Self-pay | Attending: Internal Medicine

## 2024-09-25 VITALS — BP 134/92 | HR 91 | Temp 98.7°F | Resp 16

## 2024-09-25 DIAGNOSIS — D500 Iron deficiency anemia secondary to blood loss (chronic): Secondary | ICD-10-CM | POA: Diagnosis present

## 2024-09-25 DIAGNOSIS — E611 Iron deficiency: Secondary | ICD-10-CM

## 2024-09-25 DIAGNOSIS — N920 Excessive and frequent menstruation with regular cycle: Secondary | ICD-10-CM | POA: Diagnosis present

## 2024-09-25 MED ORDER — IRON SUCROSE 20 MG/ML IV SOLN
200.0000 mg | Freq: Once | INTRAVENOUS | Status: AC
  Administered 2024-09-25: 200 mg via INTRAVENOUS
  Filled 2024-09-25: qty 10

## 2024-09-25 NOTE — Patient Instructions (Signed)

## 2024-11-20 ENCOUNTER — Inpatient Hospital Stay: Admitting: Internal Medicine

## 2024-11-20 ENCOUNTER — Inpatient Hospital Stay
